# Patient Record
Sex: Male | Born: 1945
Health system: Southern US, Community
[De-identification: ages and names within clinical notes are randomized; demographics above are authoritative.]

## PROBLEM LIST (undated history)

## (undated) DIAGNOSIS — E119 Type 2 diabetes mellitus without complications: Secondary | ICD-10-CM

## (undated) DIAGNOSIS — E785 Hyperlipidemia, unspecified: Secondary | ICD-10-CM

## (undated) HISTORY — PX: CATARACT EXTRACTION, BILATERAL: SHX1313

---

## 2013-02-28 DIAGNOSIS — M7500 Adhesive capsulitis of unspecified shoulder: Secondary | ICD-10-CM | POA: Insufficient documentation

## 2013-02-28 DIAGNOSIS — E1142 Type 2 diabetes mellitus with diabetic polyneuropathy: Secondary | ICD-10-CM | POA: Insufficient documentation

## 2013-02-28 DIAGNOSIS — M159 Polyosteoarthritis, unspecified: Secondary | ICD-10-CM | POA: Insufficient documentation

## 2013-07-24 ENCOUNTER — Ambulatory Visit (INDEPENDENT_AMBULATORY_CARE_PROVIDER_SITE_OTHER): Payer: PRIVATE HEALTH INSURANCE | Admitting: Internal Medicine

## 2013-07-24 DIAGNOSIS — Z789 Other specified health status: Secondary | ICD-10-CM

## 2013-07-24 DIAGNOSIS — Z23 Encounter for immunization: Secondary | ICD-10-CM

## 2013-07-24 MED ORDER — MEFLOQUINE HCL 250 MG PO TABS: 250.0000 mg | ORAL_TABLET | ORAL | Status: DC

## 2013-07-24 MED ORDER — AZITHROMYCIN 500 MG PO TABS: 500.0000 mg | ORAL_TABLET | Freq: Every day | ORAL | Status: DC

## 2013-07-24 NOTE — Progress Notes (Signed)
[facility]  RFV: pre-travel counseling   Subjective:    Patient ID: Garrett Rice, male    DOB: 1946/04/28, 67 y.o.   MRN: [id number]  HPI [name] is a 67yo M who is born in [place]. He is going to [place] after leaving in the mid 1980s with his wife to visit family. He is Leaving nov 10-dec 15th. [place] will [place] then [place] 1 hr to visit family. [place].  All: NKMA Med: metformin, avastin Pmhx: dm, HLD    Review of Systems     Objective:   Physical Exam        Assessment & Plan:  Will give mefloquine Gave typhoid inj Gave rx azithro for traveler's diarrhea

## 2013-10-12 DIAGNOSIS — K219 Gastro-esophageal reflux disease without esophagitis: Secondary | ICD-10-CM | POA: Insufficient documentation

## 2013-10-12 DIAGNOSIS — E119 Type 2 diabetes mellitus without complications: Secondary | ICD-10-CM | POA: Insufficient documentation

## 2014-01-11 DIAGNOSIS — E785 Hyperlipidemia, unspecified: Secondary | ICD-10-CM | POA: Insufficient documentation

## 2014-01-11 DIAGNOSIS — I10 Essential (primary) hypertension: Secondary | ICD-10-CM | POA: Insufficient documentation

## 2015-09-30 DIAGNOSIS — H353131 Nonexudative age-related macular degeneration, bilateral, early dry stage: Secondary | ICD-10-CM | POA: Insufficient documentation

## 2016-01-19 DIAGNOSIS — E039 Hypothyroidism, unspecified: Secondary | ICD-10-CM | POA: Insufficient documentation

## 2018-03-20 ENCOUNTER — Encounter (HOSPITAL_BASED_OUTPATIENT_CLINIC_OR_DEPARTMENT_OTHER): Payer: Self-pay | Admitting: Emergency Medicine

## 2018-03-20 ENCOUNTER — Emergency Department (HOSPITAL_BASED_OUTPATIENT_CLINIC_OR_DEPARTMENT_OTHER)
Admission: EM | Admit: 2018-03-20 | Discharge: 2018-03-20 | Disposition: A | Discharge status: Home / Self Care | Payer: PRIVATE HEALTH INSURANCE | Attending: Emergency Medicine | Admitting: Emergency Medicine

## 2018-03-20 ENCOUNTER — Other Ambulatory Visit: Payer: Self-pay

## 2018-03-20 ENCOUNTER — Emergency Department (HOSPITAL_BASED_OUTPATIENT_CLINIC_OR_DEPARTMENT_OTHER): Payer: PRIVATE HEALTH INSURANCE

## 2018-03-20 DIAGNOSIS — Y9289 Other specified places as the place of occurrence of the external cause: Secondary | ICD-10-CM | POA: Insufficient documentation

## 2018-03-20 DIAGNOSIS — Y990 Civilian activity done for income or pay: Secondary | ICD-10-CM | POA: Diagnosis not present

## 2018-03-20 DIAGNOSIS — E119 Type 2 diabetes mellitus without complications: Secondary | ICD-10-CM | POA: Insufficient documentation

## 2018-03-20 DIAGNOSIS — Y9389 Activity, other specified: Secondary | ICD-10-CM | POA: Diagnosis not present

## 2018-03-20 DIAGNOSIS — S99922A Unspecified injury of left foot, initial encounter: Secondary | ICD-10-CM | POA: Diagnosis present

## 2018-03-20 DIAGNOSIS — W231XXA Caught, crushed, jammed, or pinched between stationary objects, initial encounter: Secondary | ICD-10-CM | POA: Diagnosis not present

## 2018-03-20 DIAGNOSIS — S9032XA Contusion of left foot, initial encounter: Secondary | ICD-10-CM | POA: Diagnosis not present

## 2018-03-20 DIAGNOSIS — S93402A Sprain of unspecified ligament of left ankle, initial encounter: Secondary | ICD-10-CM | POA: Diagnosis not present

## 2018-03-20 HISTORY — DX: Type 2 diabetes mellitus without complications: E11.9

## 2018-03-20 MED ORDER — TRAMADOL HCL 50 MG PO TABS: 50.0000 mg | ORAL_TABLET | Freq: Four times a day (QID) | ORAL | 0 refills | Status: DC | PRN

## 2018-03-20 MED ORDER — DICLOFENAC SODIUM 1 % TD GEL: 4.0000 g | Freq: Four times a day (QID) | TRANSDERMAL | 0 refills | Status: DC

## 2018-03-20 MED FILL — DICLOFENAC SODIUM 1% GEL: 1 | 7 days supply | Qty: 100 | Fill #0

## 2018-03-20 MED FILL — traMADol HCL 50 MG TABS: 50 | 3 days supply | Qty: 15 | Fill #0

## 2018-03-20 NOTE — Discharge Instructions (Signed)
You have been evaluated for your recent injury.  Fortunately no evidence of broken bones.  Please wear ACE wrap for support along with postop shoe.  You may need a walker to help with walking around.  Keep foot elevated, use ice to help decrease swelling.  You may take 1,000mg  of Tylenol every 6 hrs as needed at home. You may take Tramadol if tylenol doesn't provide adequate pain control.  Follow up with your doctor for further care.  Use voltaren gel 4 times daily by apply to affected area for pain control.
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
## 2018-03-20 NOTE — ED Provider Notes (Signed)
[facility] EMERGENCY DEPARTMENT Provider Note   CSN: [id number] Arrival date & time: 03/20/18  1553     History   Chief Complaint Chief Complaint  Patient presents with  . Foot Injury    HPI Garrett Rice is a 72 y.o. male.  HPI   72 year old male with history of diabetes presenting for evaluation of left foot injury.  History obtained through daughter who was at bedside.  Patient report while at work, a forklift ran over his left foot.  He developed immediate pain upon impact.  Pain is sharp throbbing worse with ambulation.  Since then it has become more swollen and bruised up.  No associated numbness.  Is taking Tylenol at home and currently rates pain as 5 out of 10.  No knee or hip pain.  He is not on any blood thinner medication.  Past Medical History:  Diagnosis Date  . Diabetes mellitus without complication (HCC)     There are no active problems to display for this patient.   History reviewed. No pertinent surgical history.      Home Medications    Prior to Admission medications   Medication Sig Start Date End Date Taking? Authorizing Provider  azithromycin (ZITHROMAX) 500 MG tablet Take 1 tablet (500 mg total) by mouth daily. If needed for 3 diarrheal stools/24hr. Can stop taking if diarrhea resolves 07/24/13   Judyann MunsonSnider, Cynthia, MD  mefloquine (LARIAM) 250 MG tablet Take 1 tablet (250 mg total) by mouth every 7 (seven) days. Start 2 wks prior to leaving, take 1 tab weekly until 4 wks after returning from [place] 07/24/13   Judyann MunsonSnider, Cynthia, MD    Family History History reviewed. No pertinent family history.  Social History Social History   Tobacco Use  . Smoking status: Never Smoker  . Smokeless tobacco: Never Used  Substance Use Topics  . Alcohol use: Never    Frequency: Never  . Drug use: Never     Allergies   Patient has no known allergies.   Review of Systems Review of Systems  Constitutional: Negative for fever.    Musculoskeletal: Positive for arthralgias.  Skin: Negative for wound.  Neurological: Negative for numbness.     Physical Exam Updated Vital Signs BP (!) 149/65 (BP Location: Left Arm)   Pulse 76   Temp 98.7 F (37.1 C) (Oral)   Resp 18   Wt 65.8 kg (145 lb)   SpO2 98%   Physical Exam  Constitutional: He appears well-developed and well-nourished. No distress.  HENT:  Head: Atraumatic.  Eyes: Conjunctivae are normal.  Neck: Neck supple.  Musculoskeletal: He exhibits tenderness (Left foot/ankle: Moderate swelling and ecchymosis noted throughout the dorsum of the foot and ankle without crepitus or deformity.  Dorsalis pedis pulse palpable, brisk cap refill.  No skin breakage.  Decreased ankle range of motion secondary to pain.).  Left hip and knee normal.  Neurological: He is alert.  Skin: No rash noted.  Psychiatric: He has a normal mood and affect.  Nursing note and vitals reviewed.    ED Treatments / Results  Labs (all labs ordered are listed, but only abnormal results are displayed) Labs Reviewed - No data to display  EKG None  Radiology Dg Ankle Complete Left  Result Date: 03/20/2018 CLINICAL DATA:  Forklift injury EXAM: LEFT ANKLE COMPLETE - 3+ VIEW COMPARISON:  None. FINDINGS: There is no evidence of fracture, dislocation, or joint effusion. There is no evidence of arthropathy or other focal bone abnormality. Moderate left  lateral malleolus soft tissue swelling. IMPRESSION: Moderate lateral malleolar soft tissue swelling without acute fracture or dislocation. Electronically Signed   By: Deatra Robinson M.D.   On: 03/20/2018 17:12   Dg Foot Complete Left  Result Date: 03/20/2018 CLINICAL DATA:  Forklift injury EXAM: LEFT FOOT - COMPLETE 3+ VIEW COMPARISON:  None. FINDINGS: There is no evidence of fracture or dislocation. There is no evidence of arthropathy or other focal bone abnormality. Soft tissues are unremarkable. IMPRESSION: Negative. Electronically Signed   By:  Deatra Robinson M.D.   On: 03/20/2018 17:13    Procedures Procedures (including critical care time)  Medications Ordered in ED Medications - No data to display   Initial Impression / Assessment and Plan / ED Course  I have reviewed the triage vital signs and the nursing notes.  Pertinent labs & imaging results that were available during my care of the patient were reviewed by me and considered in my medical decision making (see chart for details).     BP (!) 149/65 (BP Location: Left Arm)   Pulse 76   Temp 98.7 F (37.1 C) (Oral)   Resp 18   Wt 65.8 kg (145 lb)   SpO2 98%    Final Clinical Impressions(s) / ED Diagnoses   Final diagnoses:  Contusion of left foot, initial encounter  Moderate left ankle sprain, initial encounter    ED Discharge Orders        Ordered    traMADol (ULTRAM) 50 MG tablet  Every 6 hours PRN     03/20/18 1725    diclofenac sodium (VOLTAREN) 1 % GEL  4 times daily     03/20/18 1727     4:28 PM Patient suffered a crush injury when a forklift ran over his left foot while at work yesterday.  Will obtain x-ray to assess for injury.  I offered patient pain medication but he declined.  There is no break in the skin.  He is neurovascularly intact.  5:31 PM X-ray of left ankle shows moderate lateral malleolar soft tissue swelling without acute fractures or dislocation.  X-ray of the left foot is unremarkable.  Treatment plan includes Ace wrap, postop shoe, walker as needed.  Patient discharged home with pain medication including Tylenol or tramadol and Voltaren gel.  Instruction provided work note provided.  Care discussed with Dr. Anitra Lauth. In order to decrease risk of narcotic abuse. Pt's record were checked using the [place] Controlled Substance database.     Fayrene Helper, PA-C 03/20/18 1733    Gwyneth Sprout, MD 03/21/18 1353

## 2018-11-07 DIAGNOSIS — F32A Depression, unspecified: Secondary | ICD-10-CM | POA: Insufficient documentation

## 2020-02-10 IMAGING — DX DG FOOT COMPLETE 3+V*L*
3 series · 3 of 3 positions shown · non-contrast
Comparison: None.

CLINICAL DATA: Forklift injury

EXAM:
LEFT FOOT - COMPLETE 3+ VIEW

[foot ap]
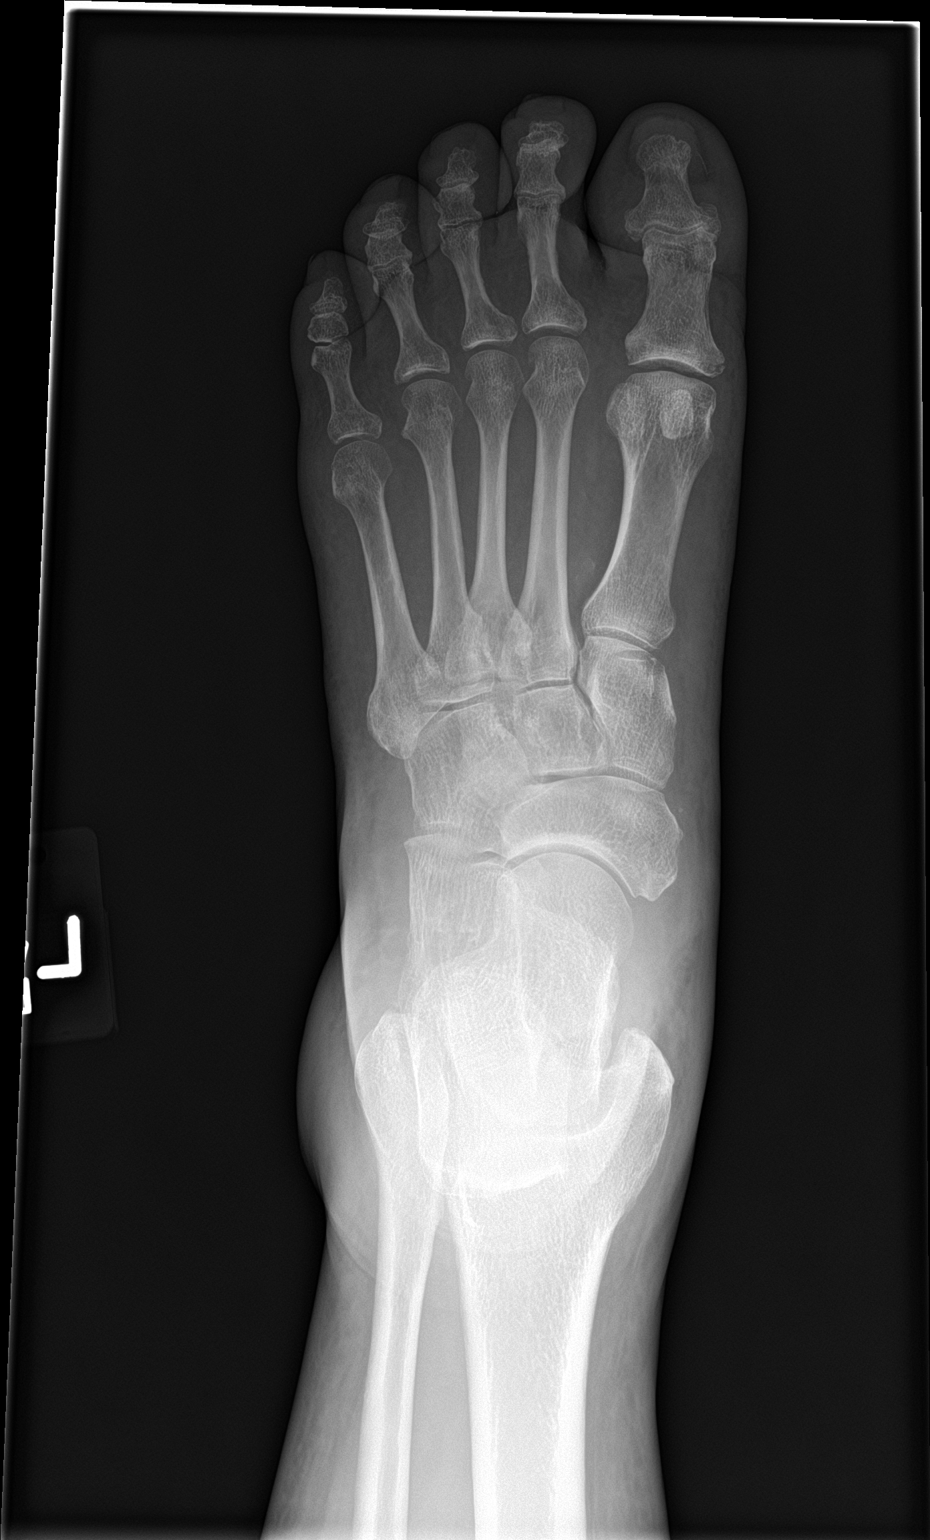

[foot obl]
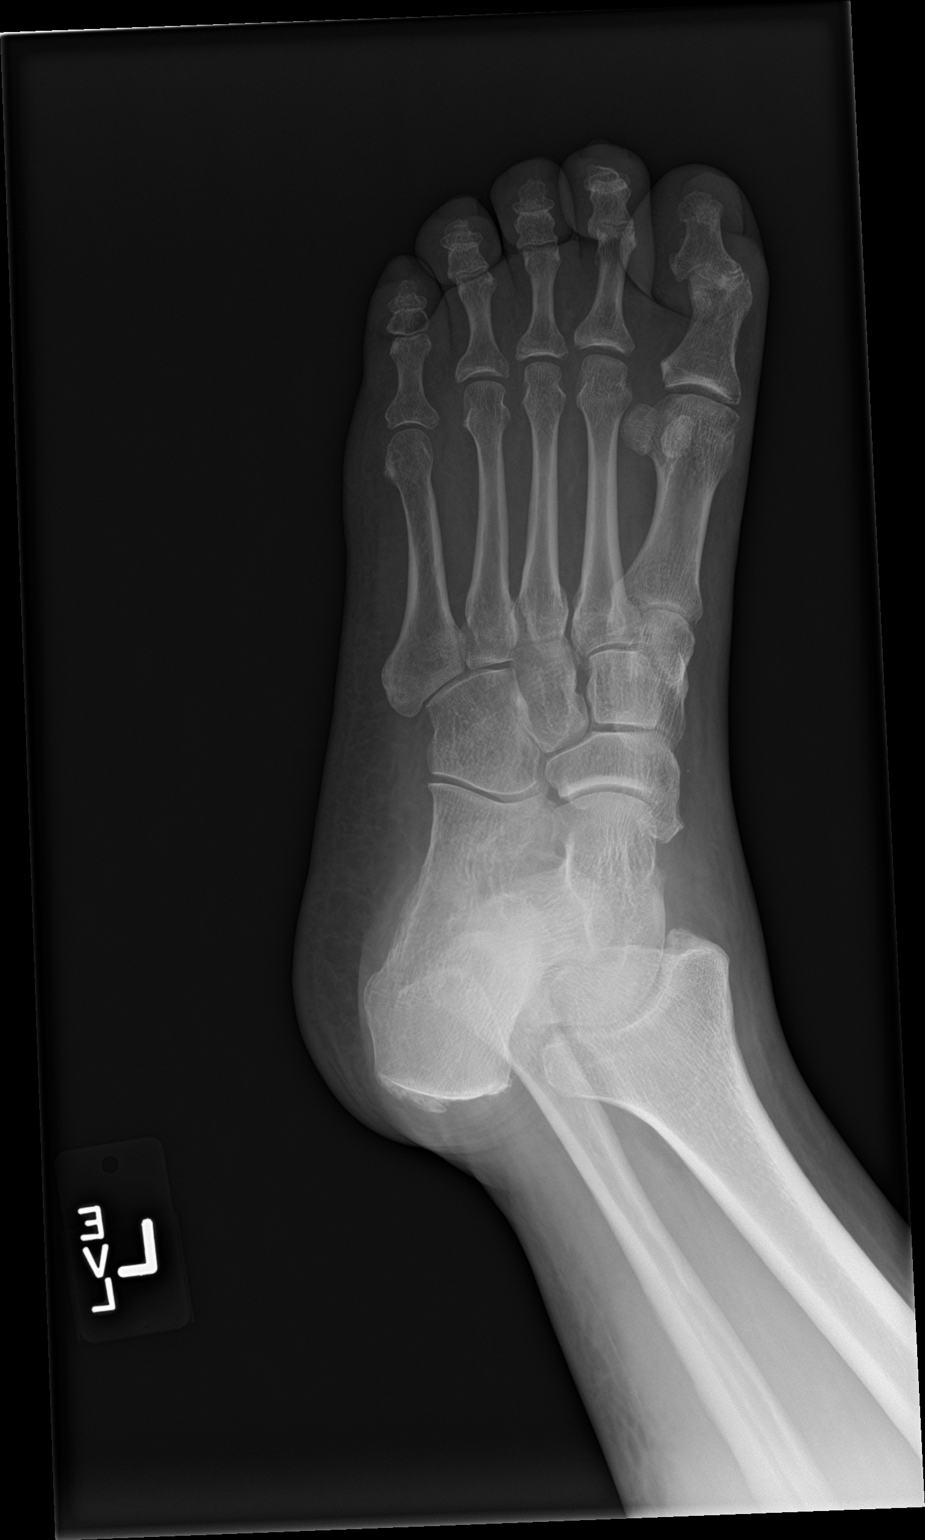

[foot lat]
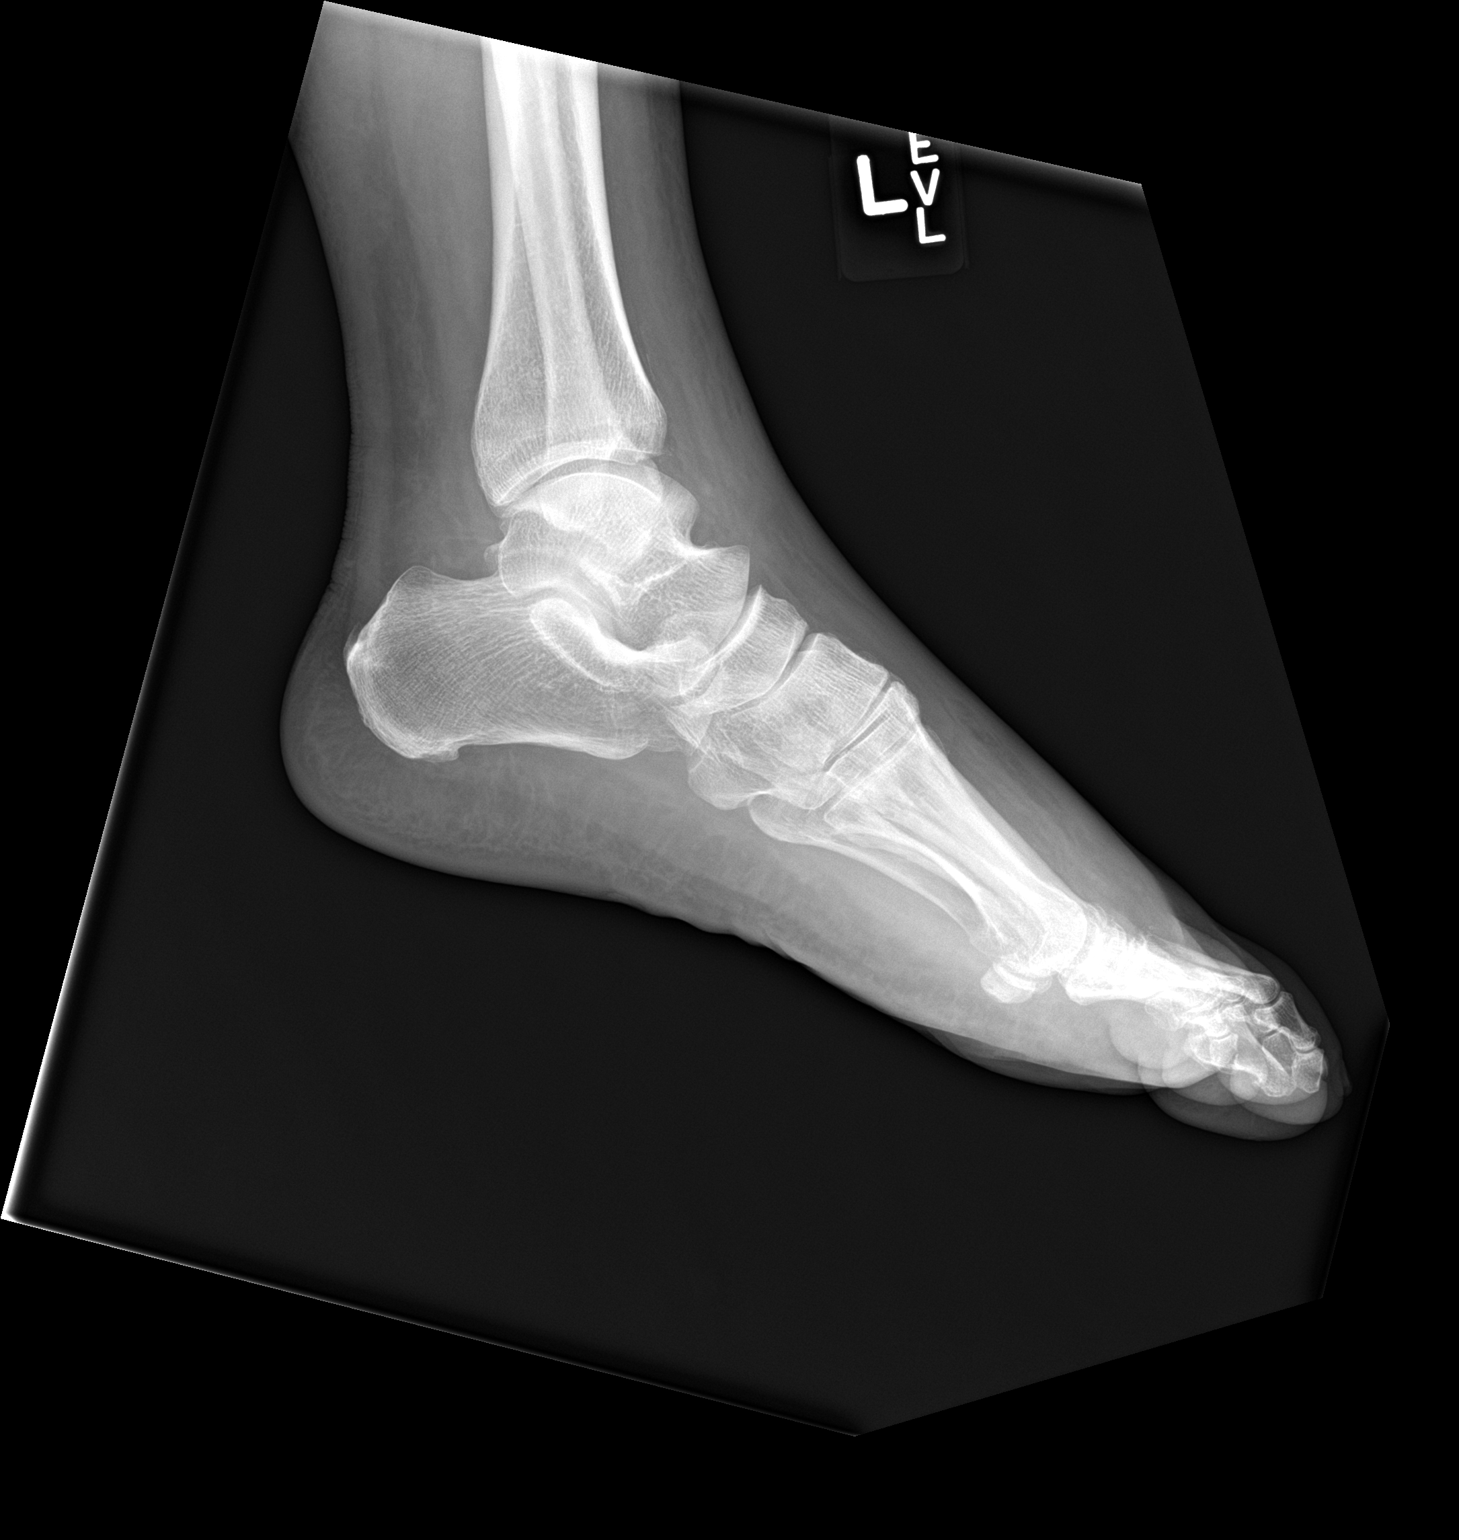

[3 of 3 positions shown; findings below may reference images not displayed]

FINDINGS: There is no evidence of fracture or dislocation. There is no
evidence of arthropathy or other focal bone abnormality. Soft
tissues are unremarkable.
IMPRESSION: Negative.

## 2020-04-11 ENCOUNTER — Other Ambulatory Visit: Payer: Self-pay

## 2020-04-11 ENCOUNTER — Encounter (HOSPITAL_BASED_OUTPATIENT_CLINIC_OR_DEPARTMENT_OTHER): Payer: Self-pay | Admitting: *Deleted

## 2020-04-11 ENCOUNTER — Emergency Department (HOSPITAL_BASED_OUTPATIENT_CLINIC_OR_DEPARTMENT_OTHER)
Admission: EM | Admit: 2020-04-11 | Discharge: 2020-04-11 | Disposition: A | Discharge status: Home / Self Care | Payer: PRIVATE HEALTH INSURANCE | Attending: Emergency Medicine | Admitting: Emergency Medicine

## 2020-04-11 DIAGNOSIS — M791 Myalgia, unspecified site: Secondary | ICD-10-CM | POA: Insufficient documentation

## 2020-04-11 DIAGNOSIS — R109 Unspecified abdominal pain: Secondary | ICD-10-CM | POA: Insufficient documentation

## 2020-04-11 DIAGNOSIS — E119 Type 2 diabetes mellitus without complications: Secondary | ICD-10-CM | POA: Insufficient documentation

## 2020-04-11 HISTORY — DX: Hyperlipidemia, unspecified: E78.5
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
## 2023-09-11 LAB — HM DIABETES EYE EXAM

## 2023-10-08 ENCOUNTER — Ambulatory Visit (INDEPENDENT_AMBULATORY_CARE_PROVIDER_SITE_OTHER): Payer: Medicare Other | Admitting: Family Medicine

## 2023-10-08 ENCOUNTER — Other Ambulatory Visit: Payer: Medicare Other

## 2023-10-08 ENCOUNTER — Encounter: Payer: Self-pay | Admitting: Family Medicine

## 2023-10-08 VITALS — BP 124/70 | HR 76 | Temp 98.7°F | Ht 63.5 in | Wt 130.0 lb

## 2023-10-08 DIAGNOSIS — I10 Essential (primary) hypertension: Secondary | ICD-10-CM

## 2023-10-08 DIAGNOSIS — Z1159 Encounter for screening for other viral diseases: Secondary | ICD-10-CM

## 2023-10-08 DIAGNOSIS — R634 Abnormal weight loss: Secondary | ICD-10-CM

## 2023-10-08 DIAGNOSIS — E039 Hypothyroidism, unspecified: Secondary | ICD-10-CM | POA: Diagnosis not present

## 2023-10-08 DIAGNOSIS — E1140 Type 2 diabetes mellitus with diabetic neuropathy, unspecified: Secondary | ICD-10-CM | POA: Insufficient documentation

## 2023-10-08 DIAGNOSIS — E782 Mixed hyperlipidemia: Secondary | ICD-10-CM | POA: Diagnosis not present

## 2023-10-08 DIAGNOSIS — E1142 Type 2 diabetes mellitus with diabetic polyneuropathy: Secondary | ICD-10-CM

## 2023-10-08 DIAGNOSIS — Z7984 Long term (current) use of oral hypoglycemic drugs: Secondary | ICD-10-CM

## 2023-10-08 LAB — URINALYSIS, ROUTINE W REFLEX MICROSCOPIC
Bilirubin Urine: NEGATIVE
Hgb urine dipstick: NEGATIVE
Leukocytes,Ua: NEGATIVE
Nitrite: NEGATIVE
Specific Gravity, Urine: 1.01 (ref 1.000–1.030)
Total Protein, Urine: NEGATIVE
Urine Glucose: >=1000 — AB
Urobilinogen, UA: 0.2 (ref 0.0–1.0)
pH: 6.5 (ref 5.0–8.0)

## 2023-10-08 LAB — COMPREHENSIVE METABOLIC PANEL
ALT: 32 U/L (ref 0–53)
AST: 19 U/L (ref 0–37)
Albumin: 4.6 g/dL (ref 3.5–5.2)
Alkaline Phosphatase: 89 U/L (ref 39–117)
BUN: 20 mg/dL (ref 6–23)
CO2: 31 meq/L (ref 19–32)
Calcium: 9.9 mg/dL (ref 8.4–10.5)
Chloride: 95 meq/L — ABNORMAL LOW (ref 96–112)
Creatinine, Ser: 1 mg/dL (ref 0.40–1.50)
GFR: 73.07 mL/min (ref >60.00)
Glucose, Bld: 339 mg/dL — ABNORMAL HIGH (ref 70–99)
Potassium: 4.6 meq/L (ref 3.5–5.1)
Sodium: 136 meq/L (ref 135–145)
Total Bilirubin: 0.5 mg/dL (ref 0.2–1.2)
Total Protein: 7.3 g/dL (ref 6.0–8.3)

## 2023-10-08 LAB — LIPID PANEL
Cholesterol: 206 mg/dL — ABNORMAL HIGH (ref 0–200)
HDL: 65.8 mg/dL (ref >39.00)
LDL Cholesterol: 108 mg/dL — ABNORMAL HIGH (ref 0–99)
NonHDL: 140.41
Total CHOL/HDL Ratio: 3
Triglycerides: 161 mg/dL — ABNORMAL HIGH (ref 0.0–149.0)
VLDL: 32.2 mg/dL (ref 0.0–40.0)

## 2023-10-08 LAB — TSH: TSH: 2.85 u[IU]/mL (ref 0.35–5.50)

## 2023-10-08 LAB — MICROALBUMIN / CREATININE URINE RATIO
Creatinine,U: 49.1 mg/dL
Microalb Creat Ratio: 1.5 mg/g (ref 0.0–30.0)
Microalb, Ur: 0.7 mg/dL (ref 0.0–1.9)

## 2023-10-08 MED ORDER — EZETIMIBE 10 MG PO TABS: 10.0000 mg | ORAL_TABLET | Freq: Every day | ORAL | 3 refills | Status: DC

## 2023-10-08 MED ORDER — EMPAGLIFLOZIN 10 MG PO TABS: 10.0000 mg | ORAL_TABLET | Freq: Every day | ORAL | 2 refills | Status: DC

## 2023-10-08 NOTE — Assessment & Plan Note (Signed)
 We will focus on optimal blood sugar control.

## 2023-10-08 NOTE — Assessment & Plan Note (Signed)
 I will check annual DM labs to assess his current control. Based on his A1c last year and his weight loss, I anticipate we will need to add to his current therapy. For now, he will continue metformin 500 mg bid and glipizide 10 mg daily.

## 2023-10-08 NOTE — Progress Notes (Signed)
 [facility] [address] [place] [address] Dept: [phone] Dept Fax: [phone]  New Patient Office Visit  Subjective:    Patient ID: Garrett Rice, male    DOB: 10-24-1945, 78 y.o..   MRN: [id number]  Chief Complaint  Patient presents with   Establish Care   Hypertension   Diabetes   Hyperlipidemia   Hypothyroidism   History of Present Illness:  Patient is in today to establish care. Garrett Rice was born in [place]. [place]  in [place]. He worked as a location manager for many years. He has been married for 50+ years. He has four children, a daughter Architect) and three sons. He has 13 grandchildren, and 1 great grandchild. He quit tobacco use 30 years ago. He only drinks alcohol on special occasions.  Garrett Rice has Type 2 diabetes. He is managed on metformin  500 mg twice daily and glipizide  10 mg daily. His daughter notes a concern over weight loss (down 18 lbs int he past 4 years). She also reports his A1c being > 12.  He has been seen by his ophthalmologist recently, who raised further concerns abouthis diabetes control.  Garrett Rice has hypertension. He is managed on HCTZ 12.5 mg daily and lisinopril  20 mg dialy.  Garrett Rice has hyperlipidemia. He is managed on atorvastatin  80 mg daily.  Garrett Rice has a history of hypothyroidism. He is managed on levothyroxine  88 mcg daily.  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 No pertinent family history. Outpatient Medications Prior to Visit  Medication Sig Dispense Refill   aspirin EC 81 MG tablet Take 81 mg by mouth daily.     atorvastatin  (LIPITOR) 80 MG tablet Take 80 mg by mouth daily.     glipiZIDE  (GLUCOTROL  XL) 10 MG 24 hr tablet Take 10 mg by mouth daily.     glucose blood (PRECISION QID TEST) test strip 1 each by Other route as needed.     hydrochlorothiazide  (HYDRODIURIL ) 12.5 MG tablet Take 12.5 mg by mouth daily.     levothyroxine  (SYNTHROID ) 88 MCG tablet Take 88 mcg by mouth daily before breakfast.     lisinopril  (ZESTRIL ) 20 MG tablet Take 20 mg by mouth daily.     metFORMIN  (GLUCOPHAGE ) 500 MG tablet Take 500 mg by mouth 2 (two) times daily with a meal.     Olopatadine HCl 0.2 % SOLN Apply to eye.     omeprazole (PRILOSEC) 40 MG capsule Take 40 mg by mouth 2 (two) times daily.     azithromycin  (ZITHROMAX ) 500 MG tablet Take 1 tablet (500 mg total) by mouth daily. If needed for 3 diarrheal stools/24hr. Can stop taking if diarrhea resolves 10 tablet 0   diclofenac  sodium (VOLTAREN ) 1 % GEL Apply 4 g topically 4 (four) times daily. 100 g 0   mefloquine  (LARIAM ) 250 MG tablet Take 1 tablet (250 mg total) by mouth every 7 (  seven) days. Start 2 wks prior to leaving, take 1 tab weekly until 4 wks after returning from [place] 22 tablet 0   traMADol  (ULTRAM ) 50 MG tablet Take 1 tablet (50 mg total) by mouth every 6 (six) hours as needed for moderate pain or severe pain. 15 tablet 0   No facility-administered medications prior to visit.   No Known Allergies Objective:   Today's Vitals   10/08/23 1017  BP: 124/70  Pulse: 76  Temp: 98.7 F (37.1 C)  TempSrc: Temporal  SpO2: 99%  Weight: 130 lb (59 kg)  Height: 5' 3.5 (1.613 m)   Body mass index is 22.67 kg/m.    General: Well developed, well nourished. No acute distress. Feet- Skin intact. No sign of maceration between toes. Nails are normal. Dorsalis pedis and posterior tibial   artery pulses are normal. 5.07 monofilament testing with some small aras that are insensate. There is a 4-5   mm round, black mole on the dorsolateral aspect of the left foot. Psych: Alert and oriented. Normal mood and affect.  Health Maintenance Due  Topic Date Due   HEMOGLOBIN A1C  Never done   Diabetic kidney evaluation - eGFR measurement  Never done   Diabetic kidney evaluation - Urine ACR  Never done   Hepatitis C Screening  Never done   Zoster Vaccines- Shingrix (1 of 2) Never done   Medicare Annual Wellness (AWV)  10/24/2023     Assessment & Plan:   Problem List Items Addressed This Visit       Cardiovascular and Mediastinum   Essential hypertension   Blood pressure is in good control. Continue HCTZ 12.5 mg daily and lisinopril  20 mg daily. I will check annual renal labs.      Relevant Medications   aspirin EC 81 MG tablet   atorvastatin  (LIPITOR) 80 MG tablet   hydrochlorothiazide  (HYDRODIURIL ) 12.5 MG tablet   lisinopril  (ZESTRIL ) 20 MG tablet   Other Relevant Orders   Comprehensive metabolic panel     Endocrine   Acquired hypothyroidism   I will check the TSH today. Continue levothyroxine  88 mcg daily for now.      Relevant Medications   levothyroxine  (SYNTHROID ) 88 MCG tablet   Other Relevant Orders   TSH   Diabetic neuropathy (HCC)   We will focus on optimal blood sugar control.      Relevant Medications   aspirin EC 81 MG tablet   atorvastatin  (LIPITOR) 80 MG tablet   glipiZIDE  (GLUCOTROL  XL) 10 MG 24 hr tablet   lisinopril  (ZESTRIL ) 20 MG tablet   metFORMIN  (GLUCOPHAGE ) 500 MG tablet   Type 2 diabetes mellitus with diabetic polyneuropathy (HCC) - Primary   I will check annual DM labs to assess his current control. Based on his A1c last year and his weight loss, I anticipate we  will need to add to his current therapy. For now, he will continue metformin  500 mg bid and glipizide  10 mg daily.      Relevant Medications   aspirin EC 81 MG tablet   atorvastatin  (LIPITOR) 80 MG tablet   glipiZIDE  (GLUCOTROL  XL) 10 MG 24 hr tablet   lisinopril  (ZESTRIL ) 20 MG tablet   metFORMIN  (GLUCOPHAGE ) 500 MG tablet   Other Relevant Orders   Microalbumin / creatinine urine ratio   Hemoglobin A1c   Urinalysis, Routine w reflex microscopic   Comprehensive metabolic panel     Other   Hyperlipidemia (Chronic)   I will check lipids today. Continue atorvastatin   80 mg daily for now.      Relevant Medications   aspirin EC 81 MG tablet   atorvastatin  (LIPITOR) 80 MG tablet   hydrochlorothiazide  (HYDRODIURIL ) 12.5 MG tablet   lisinopril  (ZESTRIL ) 20 MG tablet   Other Relevant Orders   Lipid panel   Other Visit Diagnoses       Weight loss       Etiology uncertain, but likely due to uncontrolled Type 2 diabetes.   Relevant Orders   Hemoglobin A1c   TSH   Comprehensive metabolic panel     Encounter for hepatitis C screening test for low risk patient       Relevant Orders   HCV Ab w Reflex to Quant PCR       Return for Annual preventative care.   Garnette CHRISTELLA Simpler, MD

## 2023-10-08 NOTE — Assessment & Plan Note (Signed)
 I will check lipids today. Continue atorvastatin 80 mg daily for now.

## 2023-10-08 NOTE — Assessment & Plan Note (Signed)
 I will check the TSH today. Continue levothyroxine 88 mcg daily for now.

## 2023-10-08 NOTE — Assessment & Plan Note (Signed)
 Blood pressure is in good control. Continue HCTZ 12.5 mg daily and lisinopril 20 mg daily. I will check annual renal labs.

## 2023-10-08 NOTE — Addendum Note (Signed)
 Addended by: Loyola Mast on: 10/08/2023 05:57 PM   Modules accepted: Orders

## 2023-10-09 LAB — HEMOGLOBIN A1C: Hgb A1c MFr Bld: >14 %{Hb} — ABNORMAL HIGH (ref <5.7)

## 2023-10-09 LAB — HCV INTERPRETATION

## 2023-10-09 LAB — HCV AB W REFLEX TO QUANT PCR: HCV Ab: NONREACTIVE

## 2023-10-16 ENCOUNTER — Encounter: Payer: Self-pay | Admitting: Family Medicine

## 2023-10-28 ENCOUNTER — Telehealth: Payer: Self-pay

## 2023-10-28 NOTE — Telephone Encounter (Signed)
Copied from CRM [id number]. Topic: General - Other >> Oct 28, 2023  2:13 PM Truddie Crumble wrote: Reason for CRM: patient daughter returning a call to the CMA regarding the patient lab results  Spoke to [facility], she just picked up the new medication and scheduled an appointment 11/19/23.  No further questions. Dm/cma

## 2023-11-19 ENCOUNTER — Ambulatory Visit (INDEPENDENT_AMBULATORY_CARE_PROVIDER_SITE_OTHER): Payer: Medicare Other | Admitting: Family Medicine

## 2023-11-19 ENCOUNTER — Encounter: Payer: Self-pay | Admitting: Family Medicine

## 2023-11-19 VITALS — BP 114/60 | HR 74 | Temp 98.0°F | Ht 63.5 in | Wt 126.0 lb

## 2023-11-19 DIAGNOSIS — I10 Essential (primary) hypertension: Secondary | ICD-10-CM

## 2023-11-19 DIAGNOSIS — Z7984 Long term (current) use of oral hypoglycemic drugs: Secondary | ICD-10-CM | POA: Diagnosis not present

## 2023-11-19 DIAGNOSIS — E782 Mixed hyperlipidemia: Secondary | ICD-10-CM | POA: Diagnosis not present

## 2023-11-19 DIAGNOSIS — E039 Hypothyroidism, unspecified: Secondary | ICD-10-CM | POA: Diagnosis not present

## 2023-11-19 DIAGNOSIS — E1142 Type 2 diabetes mellitus with diabetic polyneuropathy: Secondary | ICD-10-CM

## 2023-11-19 LAB — GLUCOSE, POCT (MANUAL RESULT ENTRY): POC Glucose: HIGH mg/dL — ABNORMAL HIGH (ref 70–99)

## 2023-11-19 MED ORDER — BLOOD GLUCOSE TEST VI STRP: 1.0000 | ORAL_STRIP | Freq: Three times a day (TID) | 0 refills | Status: AC

## 2023-11-19 MED ORDER — GLIPIZIDE ER 10 MG PO TB24: 10.0000 mg | ORAL_TABLET | Freq: Every day | ORAL | 3 refills | Status: DC

## 2023-11-19 MED ORDER — EMPAGLIFLOZIN 25 MG PO TABS: 25.0000 mg | ORAL_TABLET | Freq: Every day | ORAL | 3 refills | Status: DC

## 2023-11-19 MED ORDER — LANCETS MISC. MISC: 1.0000 | Freq: Three times a day (TID) | 0 refills | Status: AC

## 2023-11-19 MED ORDER — LANCET DEVICE MISC: 1.0000 | Freq: Three times a day (TID) | 0 refills | Status: AC

## 2023-11-19 MED ORDER — BLOOD GLUCOSE MONITORING SUPPL DEVI: 1.0000 | Freq: Three times a day (TID) | 0 refills | Status: AC

## 2023-11-19 NOTE — Assessment & Plan Note (Signed)
Continue atorvastatin 80 mg daily and ezetimibe 10 mg daily

## 2023-11-19 NOTE — Progress Notes (Signed)
 [facility] [address] [place] [address] Dept: [phone] Dept Fax: [phone]  Chronic Care Office Visit  Subjective:    Patient ID: Garrett Rice, male    DOB: 12/22/1945, 78 y.o..   MRN: [id number]  Chief Complaint  Patient presents with   Diabetes   Medical Interpretor: Garrett Rice (his daughter, but also a Museum/gallery exhibitions officer).  History of Present Illness:  Patient is in today for reassessment of chronic medical issues.  [name] has Type 2 diabetes. He is managed on metformin 500 mg twice daily, glipizide 10 mg daily and empagliflozin (Jardiance) 10 mg daily (added after his first visit last month). He had been having some unintended weight loss, being down 18 lbs over the past 4 years. His last A1c was > 14.   [name] has hypertension. He is managed on HCTZ 12.5 mg daily and lisinopril 20 mg dialy.   [name] has hyperlipidemia. He is managed on atorvastatin 80 mg daily and ezetimibe 10 mg daily (added after last visit).   [name] has a history of hypothyroidism. He is managed on levothyroxine 88 mcg daily.  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 No pertinent family history. Outpatient Medications Prior to Visit  Medication Sig Dispense Refill    aspirin EC 81 MG tablet Take 81 mg by mouth daily.     atorvastatin (LIPITOR) 80 MG tablet Take 80 mg by mouth daily.     ezetimibe (ZETIA) 10 MG tablet Take 1 tablet (10 mg total) by mouth daily. 90 tablet 3   glucose blood (PRECISION QID TEST) test strip 1 each by Other route as needed.     hydrochlorothiazide (HYDRODIURIL) 12.5 MG tablet Take 12.5 mg by mouth daily.     levothyroxine (SYNTHROID) 88 MCG tablet Take 88 mcg by mouth daily before breakfast.     lisinopril (ZESTRIL) 20 MG tablet Take 20 mg by mouth daily.     metFORMIN (GLUCOPHAGE) 500 MG tablet Take 500 mg by mouth 2 (two) times daily with a meal.     Olopatadine HCl 0.2 % SOLN Apply to eye.     omeprazole (PRILOSEC) 40 MG capsule Take 40 mg by mouth 2 (two) times daily.     empagliflozin (JARDIANCE) 10 MG TABS tablet Take 1 tablet (10 mg total) by mouth daily before breakfast. 30 tablet 2   glipiZIDE (GLUCOTROL XL) 10 MG 24 hr tablet Take 10 mg by mouth daily.     No facility-administered medications prior to visit.   No Known Allergies Objective:   Today's Vitals   11/19/23 1345  BP: 114/60  Pulse: 74  Temp: 98 F (36.7 C)  TempSrc: Temporal  SpO2: 96%  Weight: 126 lb (57.2 kg)  Height: 5' 3.5" (1.613 m)   Body mass index is 21.97 kg/m.   FSBS: Hi  General: Well developed, well nourished. No acute distress. HEENT: Normocephalic, non-traumatic. External ears normal. EAC  and TMs normal bilaterally. PERRL,   EOMI. Conjunctiva clear. Nose clear without congestion or rhinorrhea. Mucous membranes moist.   Oropharynx clear. Neck: Supple. No lymphadenopathy. No thyromegaly. Lungs: Clear to auscultation bilaterally. No wheezing, rales or rhonchi. CV: RRR without murmurs or rubs. Pulses 2+ bilaterally. Abdomen: Soft, non-tender. Bowel sounds positive, normal pitch and frequency. No hepatosplenomegaly. No   rebound or guarding. Extremities: Full ROM. No joint swelling or tenderness. No edema noted. Skin: Warm and  dry. No rashes. Psych: Alert and oriented. Normal mood and affect.  Health Maintenance Due  Topic Date Due   Zoster Vaccines- Shingrix (1 of 2) Never done   Medicare Annual Wellness (AWV)  10/24/2023   Lab Results    Latest Ref Rng & Units 10/08/2023   10:59 AM  CMP  Glucose 70 - 99 mg/dL 213   BUN 6 - 23 mg/dL 20   Creatinine 0.86 - 1.50 mg/dL 5.78   Sodium 469 - 629 mEq/L 136   Potassium 3.5 - 5.1 mEq/L 4.6   Chloride 96 - 112 mEq/L 95   CO2 19 - 32 mEq/L 31   Calcium 8.4 - 10.5 mg/dL 9.9   Total Protein 6.0 - 8.3 g/dL 7.3   Total Bilirubin 0.2 - 1.2 mg/dL 0.5   Alkaline Phos 39 - 117 U/L 89   AST 0 - 37 U/L 19   ALT 0 - 53 U/L 32    Last lipids Lab Results  Component Value Date   CHOL 206 (H) 10/08/2023   HDL 65.80 10/08/2023   LDLCALC 108 (H) 10/08/2023   TRIG 161.0 (H) 10/08/2023   CHOLHDL 3 10/08/2023   Last hemoglobin A1c Lab Results  Component Value Date   HGBA1C >14.0 (H) 10/08/2023   Last thyroid functions Lab Results  Component Value Date   TSH 2.85 10/08/2023     Assessment & Plan:   Problem List Items Addressed This Visit       Cardiovascular and Mediastinum   Essential hypertension   Blood pressure is in good control. Continue HCTZ 12.5 mg daily and lisinopril 20 mg daily.        Endocrine   Acquired hypothyroidism   TSH is at goal. Continue levothyroxine 88 mcg daily.      Type 2 diabetes mellitus with diabetic polyneuropathy (HCC) - Primary   Diabetes remains in very poor control. Continue metformin 500 mg bid and glipizide 10 mg daily. I will increase his empagliflozin to 25 mg daily. I will also refer him for diabetic teaching.      Relevant Medications   glipiZIDE (GLUCOTROL XL) 10 MG 24 hr tablet   empagliflozin (JARDIANCE) 25 MG TABS tablet   Blood Glucose Monitoring Suppl DEVI   Glucose Blood (BLOOD GLUCOSE TEST STRIPS) STRP   Lancet Device MISC   Lancets Misc. MISC   Other Relevant Orders   Amb Referral to Nutrition and  Diabetic Education   POCT glucose (manual entry) (Completed)     Other   Hyperlipidemia (Chronic)   Continue atorvastatin 80 mg daily and ezetimibe 10 mg daily.       Return in about 6 weeks (around 12/31/2023) for Reassessment.   Loyola Mast, MD

## 2023-11-19 NOTE — Assessment & Plan Note (Signed)
 Blood pressure is in good control. Continue HCTZ 12.5 mg daily and lisinopril 20 mg daily.

## 2023-11-19 NOTE — Assessment & Plan Note (Addendum)
 Diabetes remains in very poor control. Continue metformin 500 mg bid and glipizide 10 mg daily. I will increase his empagliflozin to 25 mg daily. I will also refer him for diabetic teaching.

## 2023-11-19 NOTE — Assessment & Plan Note (Signed)
 TSH is at goal. Continue levothyroxine 88 mcg daily.

## 2024-01-01 ENCOUNTER — Ambulatory Visit: Payer: Medicare Other | Admitting: Family Medicine

## 2024-01-01 ENCOUNTER — Encounter: Payer: Self-pay | Admitting: Family Medicine

## 2024-01-01 ENCOUNTER — Telehealth: Payer: Self-pay

## 2024-01-01 VITALS — BP 124/66 | HR 80 | Temp 97.7°F | Ht 63.5 in | Wt 118.4 lb

## 2024-01-01 DIAGNOSIS — E1142 Type 2 diabetes mellitus with diabetic polyneuropathy: Secondary | ICD-10-CM | POA: Diagnosis not present

## 2024-01-01 DIAGNOSIS — E782 Mixed hyperlipidemia: Secondary | ICD-10-CM

## 2024-01-01 DIAGNOSIS — E039 Hypothyroidism, unspecified: Secondary | ICD-10-CM | POA: Diagnosis not present

## 2024-01-01 DIAGNOSIS — R634 Abnormal weight loss: Secondary | ICD-10-CM | POA: Diagnosis not present

## 2024-01-01 DIAGNOSIS — I10 Essential (primary) hypertension: Secondary | ICD-10-CM | POA: Diagnosis not present

## 2024-01-01 DIAGNOSIS — R2689 Other abnormalities of gait and mobility: Secondary | ICD-10-CM

## 2024-01-01 DIAGNOSIS — Z7984 Long term (current) use of oral hypoglycemic drugs: Secondary | ICD-10-CM

## 2024-01-01 LAB — LIPID PANEL
Cholesterol: 111 mg/dL (ref 0–200)
HDL: 43.6 mg/dL (ref >39.00)
LDL Cholesterol: 45 mg/dL (ref 0–99)
NonHDL: 66.98
Total CHOL/HDL Ratio: 3
Triglycerides: 110 mg/dL (ref 0.0–149.0)
VLDL: 22 mg/dL (ref 0.0–40.0)

## 2024-01-01 LAB — GLUCOSE, RANDOM: Glucose, Bld: 226 mg/dL — ABNORMAL HIGH (ref 70–99)

## 2024-01-01 LAB — HEMOGLOBIN A1C: Hgb A1c MFr Bld: 14.4 % — ABNORMAL HIGH (ref 4.6–6.5)

## 2024-01-01 MED ORDER — SITAGLIPTIN 25 MG PO TABS: 25.0000 mg | ORAL_TABLET | Freq: Every day | ORAL | 3 refills | Status: DC

## 2024-01-01 MED ORDER — HYDROCHLOROTHIAZIDE 12.5 MG PO TABS
12.5000 mg | ORAL_TABLET | Freq: Every day | ORAL | 3 refills | Status: AC
Start: 2024-01-01 — End: ?

## 2024-01-01 MED ORDER — ATORVASTATIN CALCIUM 80 MG PO TABS: 80.0000 mg | ORAL_TABLET | Freq: Every day | ORAL | 3 refills | Status: DC

## 2024-01-01 MED ORDER — LISINOPRIL 20 MG PO TABS: 20.0000 mg | ORAL_TABLET | Freq: Every day | ORAL | 3 refills | Status: DC

## 2024-01-01 MED ORDER — LEVOTHYROXINE SODIUM 88 MCG PO TABS: 88.0000 ug | ORAL_TABLET | Freq: Every day | ORAL | 3 refills | Status: DC

## 2024-01-01 MED ORDER — METFORMIN HCL 500 MG PO TABS
500.0000 mg | ORAL_TABLET | Freq: Two times a day (BID) | ORAL | 3 refills | Status: DC
Start: 2024-01-01 — End: 2024-04-01

## 2024-01-01 NOTE — Assessment & Plan Note (Signed)
 Likely due to uncontrolled diabetes. We will continue to focus on improved glycemic control.

## 2024-01-01 NOTE — Assessment & Plan Note (Addendum)
 Family reports blood sugars are now 120-150, which is much improved from before. Continue metformin 500 mg 2 tabs bid, glipizide 10 mg daily and empagliflozin 25 mg daily. I will renew the referral for diabetic teaching and provide patient's daughter with the number to call regarding this. I will check his A1c today.

## 2024-01-01 NOTE — Assessment & Plan Note (Signed)
 This may be multifactorial. I will check x-rays of his left hip and ankle. Might consider neuro referral in light of weakness in the ankle dorsiflexors and mildly positive Romberg's sign.

## 2024-01-01 NOTE — Assessment & Plan Note (Signed)
 TSH is at goal. Continue levothyroxine 88 mcg daily.

## 2024-01-01 NOTE — Progress Notes (Addendum)
 [facility] [address] [place] [address] Dept: [phone] Dept Fax: [phone]  Chronic Care Office Visit  Subjective:    Patient ID: Garrett Rice, male    DOB: 06/30/1946, 78 y.o..   MRN: [id number]  Chief Complaint  Patient presents with   Diabetes    6  week f/u DM.  No concerns.   BS  120- 150.   Medical Interpretor: Garrett Rice (his daughter, but also a Museum/gallery exhibitions officer).   History of Present Illness:  Patient is in today for reassessment of chronic medical issues.  Garrett Rice has Type 2 diabetes. He is managed on metformin 500 mg 2 tabs twice daily, glipizide 10 mg daily and empagliflozin (Jardiance) 25 mg daily (increased at last visit). He had been having some unintended weight loss, being down 12 lbs over the past 3 months and 30 lbs in the past 4 years. His last A1c was > 14. I had referred Garrett Rice for diabetes teaching.    Garrett Rice has hypertension. He is managed on HCTZ 12.5 mg daily and lisinopril 20 mg dialy.   Garrett Rice has hyperlipidemia. He is managed on atorvastatin 80 mg daily and ezetimibe 10 mg daily.   Garrett Rice has a history of hypothyroidism. He is managed on levothyroxine 88 mcg daily.  Garrett Rice daughter has concerns about his unsteadiness when walking. She wonders if his balance is off. He denies particular pain in his hip or knee.  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 Outpatient Medications Prior to Visit  Medication Sig Dispense Refill   aspirin EC 81 MG tablet Take 81 mg by mouth daily.     Blood Glucose Monitoring Suppl DEVI 1 each by Does not apply route in the morning, at noon, and at bedtime. May substitute to any manufacturer covered by patient's insurance. 1 each 0   empagliflozin (JARDIANCE) 25 MG TABS tablet Take 1 tablet (25 mg total) by mouth daily before breakfast. 30 tablet 3   erythromycin ophthalmic ointment SMARTSIG:In Eye(s)     ezetimibe (ZETIA) 10 MG tablet Take 1 tablet (10 mg total) by mouth daily. 90 tablet 3   glipiZIDE (GLUCOTROL XL) 10 MG 24 hr tablet Take 1 tablet (10 mg total) by mouth daily. 90 tablet 3   glucose blood (PRECISION QID TEST) test strip 1 each by Other route as needed.     Olopatadine HCl 0.2 % SOLN Apply to eye.     omeprazole (PRILOSEC) 40 MG capsule Take 40 mg by mouth 2 (two) times daily.     atorvastatin (LIPITOR) 80 MG tablet Take 80 mg by mouth daily.     hydrochlorothiazide (HYDRODIURIL) 12.5 MG tablet Take 12.5 mg by mouth daily.     levothyroxine (SYNTHROID) 88 MCG tablet Take 88 mcg by mouth daily before breakfast.  lisinopril (ZESTRIL) 20 MG tablet Take 20 mg by mouth daily.     metFORMIN (GLUCOPHAGE) 500 MG tablet Take 500 mg by mouth 2 (two) times daily with a meal.     No facility-administered medications prior to visit.   No Known Allergies Objective:   Today's Vitals   01/01/24 1012  BP: 124/66  Pulse: 80  Temp: 97.7 F (36.5 C)  TempSrc: Temporal  SpO2: 95%  Weight: 118 lb 6.4 oz (53.7 kg)  Height: 5' 3.5" (1.613 m)   Body mass index is 20.64 kg/m.   General: Well developed, well nourished. No acute distress. Extremities: Full ROM of  left hip and knee. No joint swelling or tenderness. There is clicking with   flexion/extension of the left knee. Patient's gait is antalgic and he seems to favor the left leg. No edema noted. Neuro: Romberg test shows mild swaying, but good correction of balance. There is some wekaness in the left   foot dorsiflexors. Psych: Alert and oriented. Normal mood and affect.  Health Maintenance Due  Topic Date Due   Zoster Vaccines- Shingrix (1 of 2) Never done   Medicare Annual Wellness (AWV)  10/24/2023     Assessment & Plan:   Problem List Items Addressed This Visit       Cardiovascular and Mediastinum   Essential hypertension - Primary   Blood pressure is in good control. Continue HCTZ 12.5 mg daily and lisinopril 20 mg daily.      Relevant Medications   atorvastatin (LIPITOR) 80 MG tablet   hydrochlorothiazide (HYDRODIURIL) 12.5 MG tablet   lisinopril (ZESTRIL) 20 MG tablet     Endocrine   Acquired hypothyroidism   TSH is at goal. Continue levothyroxine 88 mcg daily.      Relevant Medications   levothyroxine (SYNTHROID) 88 MCG tablet   Type 2 diabetes mellitus with diabetic polyneuropathy (HCC)   Family reports blood sugars are now 120-150, which is much improved from before. Continue metformin 500 mg 2 tabs bid, glipizide 10 mg daily and empagliflozin 25 mg daily. I will renew the referral for diabetic teaching and provide patient's daughter with the number to call regarding this. I will check his A1c today.      Relevant Medications   atorvastatin (LIPITOR) 80 MG tablet   metFORMIN (GLUCOPHAGE) 500 MG tablet   lisinopril (ZESTRIL) 20 MG tablet   Other Relevant Orders   Glucose, random (Completed)   Hemoglobin A1c (Completed)   Amb Referral to Nutrition and Diabetic Education     Other   Hyperlipidemia (Chronic)   Continue atorvastatin 80 mg daily and ezetimibe 10 mg daily. I will check lipids today.      Relevant Medications   atorvastatin (LIPITOR) 80 MG tablet    hydrochlorothiazide (HYDRODIURIL) 12.5 MG tablet   lisinopril (ZESTRIL) 20 MG tablet   Other Relevant Orders   Lipid panel (Completed)   Antalgic gait   This may be multifactorial. I will check x-rays of his left hip and ankle. Might consider neuro referral in light of weakness in the ankle dorsiflexors and mildly positive Romberg's sign.      Relevant Orders   DG Hip Unilat W OR W/O Pelvis Min 4 Views Left   DG Knee Complete 4 Views Right   Weight loss, unintentional   Likely due to uncontrolled diabetes. We will continue to focus on improved glycemic control.       Return in about 3 months (around 04/01/2024) for Reassessment.   Bertram Millard  Veto Kemps, MD

## 2024-01-01 NOTE — Addendum Note (Signed)
 Addended by: Loyola Mast on: 01/01/2024 02:37 PM   Modules accepted: Orders

## 2024-01-01 NOTE — Assessment & Plan Note (Signed)
 Continue atorvastatin 80 mg daily and ezetimibe 10 mg daily. I will check lipids today.

## 2024-01-01 NOTE — Telephone Encounter (Signed)
 Copied from CRM [id number]. Topic: General - Other >> Jan 01, 2024 10:52 AM [place] E wrote: Reason for CRM: Kya from [facility] called regarding patient's referral that was put in today 4/9. Kya stated that they are only required to call the patient 3 times to get an appointment set up, which they did call him 3 times for the referral that was placed on 2/25, and they will call him again regarding the new referral that was placed. Kya also stated that they are leaving voice messages in [place] and are leaving it up to the patient to call them back after they have attempted 3 times. Kya just wanted to make patient's PCP aware.

## 2024-01-01 NOTE — Telephone Encounter (Signed)
 Gave patient's daughter the number and she will call and schedule the appointment. Dm/cma

## 2024-01-01 NOTE — Assessment & Plan Note (Signed)
 Blood pressure is in good control. Continue HCTZ 12.5 mg daily and lisinopril 20 mg daily.

## 2024-01-09 ENCOUNTER — Other Ambulatory Visit: Payer: Self-pay | Admitting: Family Medicine

## 2024-02-13 ENCOUNTER — Other Ambulatory Visit: Payer: Self-pay | Admitting: Family Medicine

## 2024-03-19 ENCOUNTER — Emergency Department (HOSPITAL_BASED_OUTPATIENT_CLINIC_OR_DEPARTMENT_OTHER)
Admission: EM | Admit: 2024-03-19 | Discharge: 2024-03-20 | Disposition: A | Discharge status: Home / Self Care | Attending: Emergency Medicine | Admitting: Emergency Medicine

## 2024-03-19 ENCOUNTER — Other Ambulatory Visit: Payer: Self-pay

## 2024-03-19 ENCOUNTER — Emergency Department (HOSPITAL_BASED_OUTPATIENT_CLINIC_OR_DEPARTMENT_OTHER)

## 2024-03-19 ENCOUNTER — Ambulatory Visit: Payer: Self-pay | Admitting: *Deleted

## 2024-03-19 DIAGNOSIS — Z7984 Long term (current) use of oral hypoglycemic drugs: Secondary | ICD-10-CM | POA: Insufficient documentation

## 2024-03-19 DIAGNOSIS — R739 Hyperglycemia, unspecified: Secondary | ICD-10-CM | POA: Diagnosis present

## 2024-03-19 DIAGNOSIS — N179 Acute kidney failure, unspecified: Secondary | ICD-10-CM | POA: Diagnosis not present

## 2024-03-19 DIAGNOSIS — E1165 Type 2 diabetes mellitus with hyperglycemia: Secondary | ICD-10-CM | POA: Diagnosis not present

## 2024-03-19 DIAGNOSIS — Z7982 Long term (current) use of aspirin: Secondary | ICD-10-CM | POA: Insufficient documentation

## 2024-03-19 LAB — I-STAT VENOUS BLOOD GAS, ED
Acid-Base Excess: 0 mmol/L (ref 0.0–2.0)
Bicarbonate: 27.2 mmol/L (ref 20.0–28.0)
Calcium, Ion: 1.2 mmol/L (ref 1.15–1.40)
HCT: 42 % (ref 39.0–52.0)
Hemoglobin: 14.3 g/dL (ref 13.0–17.0)
O2 Saturation: 35 %
Patient temperature: 97.7
Potassium: 5.5 mmol/L — ABNORMAL HIGH (ref 3.5–5.1)
Sodium: 133 mmol/L — ABNORMAL LOW (ref 135–145)
TCO2: 29 mmol/L (ref 22–32)
pCO2, Ven: 52.1 mmHg (ref 44–60)
pH, Ven: 7.324 (ref 7.25–7.43)
pO2, Ven: 23 mmHg — CL (ref 32–45)

## 2024-03-19 LAB — COMPREHENSIVE METABOLIC PANEL WITH GFR
ALT: 100 U/L — ABNORMAL HIGH (ref 0–44)
AST: 33 U/L (ref 15–41)
Albumin: 4.4 g/dL (ref 3.5–5.0)
Alkaline Phosphatase: 99 U/L (ref 38–126)
Anion gap: 16 — ABNORMAL HIGH (ref 5–15)
BUN: 30 mg/dL — ABNORMAL HIGH (ref 8–23)
CO2: 22 mmol/L (ref 22–32)
Calcium: 9.8 mg/dL (ref 8.9–10.3)
Chloride: 92 mmol/L — ABNORMAL LOW (ref 98–111)
Creatinine, Ser: 1.44 mg/dL — ABNORMAL HIGH (ref 0.61–1.24)
GFR, Estimated: 50 mL/min — ABNORMAL LOW (ref >60)
Glucose, Bld: 583 mg/dL (ref 70–99)
Potassium: 4.6 mmol/L (ref 3.5–5.1)
Sodium: 130 mmol/L — ABNORMAL LOW (ref 135–145)
Total Bilirubin: 0.5 mg/dL (ref 0.0–1.2)
Total Protein: 7.4 g/dL (ref 6.5–8.1)

## 2024-03-19 LAB — CBC WITH DIFFERENTIAL/PLATELET
Abs Immature Granulocytes: 0.02 10*3/uL (ref 0.00–0.07)
Basophils Absolute: 0.1 10*3/uL (ref 0.0–0.1)
Basophils Relative: 1 %
Eosinophils Absolute: 0.1 10*3/uL (ref 0.0–0.5)
Eosinophils Relative: 1 %
HCT: 40.6 % (ref 39.0–52.0)
Hemoglobin: 13.3 g/dL (ref 13.0–17.0)
Immature Granulocytes: 0 %
Lymphocytes Relative: 23 %
Lymphs Abs: 1.5 10*3/uL (ref 0.7–4.0)
MCH: 25.2 pg — ABNORMAL LOW (ref 26.0–34.0)
MCHC: 32.8 g/dL (ref 30.0–36.0)
MCV: 76.9 fL — ABNORMAL LOW (ref 80.0–100.0)
Monocytes Absolute: 0.5 10*3/uL (ref 0.1–1.0)
Monocytes Relative: 8 %
Neutro Abs: 4.3 10*3/uL (ref 1.7–7.7)
Neutrophils Relative %: 67 %
Platelets: 294 10*3/uL (ref 150–400)
RBC: 5.28 MIL/uL (ref 4.22–5.81)
RDW: 13.2 % (ref 11.5–15.5)
WBC: 6.4 10*3/uL (ref 4.0–10.5)
nRBC: 0 % (ref 0.0–0.2)

## 2024-03-19 LAB — CBG MONITORING, ED: Glucose-Capillary: 554 mg/dL (ref 70–99)

## 2024-03-19 MED ORDER — LACTATED RINGERS IV BOLUS
1000.0000 mL | Freq: Once | INTRAVENOUS | Status: AC
  Administered 2024-03-19: 1000 mL via INTRAVENOUS

## 2024-03-19 MED ORDER — INSULIN ASPART 100 UNIT/ML IJ SOLN
10.0000 [IU] | Freq: Once | INTRAMUSCULAR | Status: AC
  Administered 2024-03-19: 10 [IU] via SUBCUTANEOUS

## 2024-03-19 NOTE — Telephone Encounter (Signed)
 Copied from CRM [id number]. Topic: Clinical - Red Word Triage >> Mar 19, 2024 10:43 AM Mercedes MATSU wrote: Red Word that prompted transfer to Nurse Triage: Patients daughter [facility], called in stating that patients blood pressure is in the 400's and has been that way since yesterday. Reason for Disposition  Blood glucose > 400 mg/dL (77.7 mmol/L)  Answer Assessment - Initial Assessment Questions 1. BLOOD GLUCOSE: What is your blood glucose level?      Daughter Simmaly calling in.    His blood sugar was 400 yesterday.   This morning it's still 400.   Should I take him to the ED?    I'm at work so I'm not with my father. He has a slight fever.   He is not dizzy or having shortness of breath or I would have taken him to the ED. He is on Jardiance .      I'm the one who fixes his medication.   I don't know if he is taking his medication or not correctly.    He is answering my questions properly this morning.    He is acting normally when I spoke with him.   I told my mother to keep an eye on him for symptoms.   If he started having symptoms to take him to the ED.     2. ONSET: When did you check the blood glucose?     This morning it was 400.    On Tues. He ate some fast food.   He drank a Coke.   He does not exercise.   I'm trying to get him to do my treadmill but he doesn't.   The food he is eating is not correct.    He is going to the dietitian but he is not eating properly.    I'm not with him 24 hours so I don't know what all he is eating.   He is c/o being tired.    3. USUAL RANGE: What is your glucose level usually? (e.g., usual fasting morning value, usual evening value)     130s and 140s.     In our culture our food is heavy with salt and sodium.  He cooks himself and he uses a lot of salt and stuff.    I'm working so I can't control him.   He is trying but it's depressing to him.      4. KETONES: Do you check for ketones (urine or blood test strips)? If Yes, ask: What  does the test show now?      Not asked 5. TYPE 1 or 2:  Do you know what type of diabetes you have?  (e.g., Type 1, Type 2, Gestational; doesn't know)      Type 2 6. INSULIN: Do you take insulin? What type of insulin(s) do you use? What is the mode of delivery? (syringe, pen; injection or pump)?      No    not now.   They took him off of it a long time ago. 7. DIABETES PILLS: Do you take any pills for your diabetes? If Yes, ask: Have you missed taking any pills recently?     Jardiance  8. OTHER SYMPTOMS: Do you have any symptoms? (e.g., fever, frequent urination, difficulty breathing, dizziness, weakness, vomiting)     No symptoms except fatigue.       9. PREGNANCY: Is there any chance you are pregnant? When was your last menstrual period?     N/A  Protocols used: Diabetes - High Blood Sugar-A-AH Daughter Simmaly called in.   She is not with her father presently however she was able to answer the triage questions.  Pt's blood sugar is 400 last night and again this morning.   Only complaint is fatigue.  He denies shortness of breath, dizziness, she mentioned he is answering her questions appropriately and he is independent.   If he develops any symptoms she will have him go to the ED. He is not eating right.  Been eating fast food more than he should.   He is having difficulty eating properly.   He cooks for himself so doesn't always follow the diabetic diet guidelines. She is seeking advice what to do for his elevated glucose of 400.    He takes Jardiance  which he is taking.  Simmaly is monitoring his medications and he seems to be taking them properly.  She can be reached at the number in the chart Simmaly [phone].   Pt does not speak english.   Simmaly does.  FYI Only or Action Required?: Action required by provider: clinical question for provider.  Patient was last seen in primary care on 01/01/2024 by Thedora Garnette HERO, MD. Called Nurse Triage reporting Blood Sugar  Problem. Symptoms began yesterday. Interventions attempted: Dietary changes. Symptoms are: rapidly worsening.  Triage Disposition: Call PCP Now  Patient/caregiver understands and will follow disposition?:             FYI Only or Action Required?: Action required by provider: clinical question for provider.  Patient was last seen in primary care on 01/01/2024 by Thedora Garnette HERO, MD. Called Nurse Triage reporting Blood Sugar Problem. Symptoms began yesterday. Interventions attempted: Dietary changes. Symptoms are: rapidly worsening Glucose 400 last night and this morning.  Triage Disposition: Call PCP Now  Patient/caregiver understands and will follow disposition?:  Yes

## 2024-03-19 NOTE — ED Triage Notes (Signed)
 Pt arrives with c/o fatigue that started few days ago. Per family, pts blood sugar hs been elevated over the past few days. Pt denies missing any doses of his diabetes meds. Pt is suppose to take metformin , glipizide , and sitagliptin  for diabetes. Pt denies n/v.

## 2024-03-19 NOTE — ED Provider Notes (Signed)
 [facility] Provider Note   CSN: [id number] Arrival date & time: 03/19/24  2021     Patient presents with: Fatigue   Demerius Christina is a 78 y.o. male.   The history is provided by the patient, a relative and medical records. Language interpreter used: Daughter acted as Engineer, technical sales, she is a Ecologist.   Angell Pauling is a 78 y.o. male who presents to the Emergency Department complaining of elevated blood sugar.  He presents to the emergency department accompanied by his daughter for elevated blood sugars that started on Monday with sugars up to 400.  He did go to urgent care today for this symptom due to being unable to get a hold of his PCP.  When he was there it was 600, which prompted ED visit.  No associate fatigue, shortness of breath, chest pain, headache, abdominal pain, nausea, vomiting, fever.  He does have some fatigue lately.  He has been doing activities such as mowing the lawn without difficulty.  He has had some dietary indiscretions recently including eating sugary foods.  He has been compliant with his medications for diabetes including metformin , glipizide  and sitagliptin     Prior to Admission medications   Medication Sig Start Date End Date Taking? Authorizing Provider  ACCU-CHEK GUIDE TEST test strip USE 1 STRIP TO CHECK GLUCOSE THREE TIMES DAILY. TEST IN THE MORNING, AT NOON, AND AT NIGHT 01/10/24   Thedora Garnette HERO, MD  Accu-Chek Softclix Lancets lancets USE 1  TO CHECK GLUCOSE THREE TIMES DAILY TEST  IN  THE  MORNING,  AT  NOON,  AND  AT  BEDTIME. 02/14/24   Thedora Garnette HERO, MD  aspirin EC 81 MG tablet Take 81 mg by mouth daily. 04/12/14   [provider]  atorvastatin  (LIPITOR) 80 MG tablet Take 1 tablet (80 mg total) by mouth daily. 01/01/24   Thedora Garnette HERO, MD  Blood Glucose Monitoring Suppl DEVI 1 each by Does not apply route in the morning, at noon, and at bedtime. May substitute to  any manufacturer covered by patient's insurance. 11/19/23   Thedora Garnette HERO, MD  empagliflozin  (JARDIANCE ) 25 MG TABS tablet Take 1 tablet (25 mg total) by mouth daily before breakfast. 11/19/23   Thedora Garnette HERO, MD  erythromycin ophthalmic ointment SMARTSIG:In Eye(s) 12/30/23   [provider]  ezetimibe  (ZETIA ) 10 MG tablet Take 1 tablet (10 mg total) by mouth daily. 10/08/23   Thedora Garnette HERO, MD  glipiZIDE  (GLUCOTROL  XL) 10 MG 24 hr tablet Take 1 tablet (10 mg total) by mouth daily. 11/19/23   Thedora Garnette HERO, MD  hydrochlorothiazide  (HYDRODIURIL ) 12.5 MG tablet Take 1 tablet (12.5 mg total) by mouth daily. 01/01/24   Thedora Garnette HERO, MD  levothyroxine  (SYNTHROID ) 88 MCG tablet Take 1 tablet (88 mcg total) by mouth daily before breakfast. 01/01/24   Thedora Garnette HERO, MD  lisinopril  (ZESTRIL ) 20 MG tablet Take 1 tablet (20 mg total) by mouth daily. 01/01/24   Thedora Garnette HERO, MD  metFORMIN  (GLUCOPHAGE ) 500 MG tablet Take 1 tablet (500 mg total) by mouth 2 (two) times daily with a meal. 01/01/24   Thedora Garnette HERO, MD  Olopatadine HCl 0.2 % SOLN Apply to eye. 01/29/20   [provider]  omeprazole (PRILOSEC) 40 MG capsule Take 40 mg by mouth 2 (two) times daily.    [provider]  SITagliptin  25 MG TABS Take 25 mg by mouth daily at 12 noon.  01/01/24   Thedora Garnette HERO, MD    Allergies: Patient has no known allergies.    Review of Systems  All other systems reviewed and are negative.   Updated Vital Signs BP (!) 104/56   Pulse 78   Temp 98 F (36.7 C) (Oral)   Resp 18   Wt 48.1 kg   SpO2 97%   BMI 18.48 kg/m   Physical Exam Vitals and nursing note reviewed.  Constitutional:      Appearance: He is well-developed.  HENT:     Head: Normocephalic and atraumatic.   Cardiovascular:     Rate and Rhythm: Normal rate and regular rhythm.     Heart sounds: No murmur heard. Pulmonary:     Effort: Pulmonary effort is normal. No respiratory distress.     Breath sounds: Normal  breath sounds.  Abdominal:     Palpations: Abdomen is soft.     Tenderness: There is no abdominal tenderness. There is no guarding or rebound.   Musculoskeletal:        General: No swelling or tenderness.     Comments: No ulcers or wounds to the feet.  2+ DP pulses.   Skin:    General: Skin is warm and dry.   Neurological:     Mental Status: He is alert and oriented to person, place, and time.   Psychiatric:        Behavior: Behavior normal.     (all labs ordered are listed, but only abnormal results are displayed) Labs Reviewed  URINALYSIS, ROUTINE W REFLEX MICROSCOPIC - Abnormal; Notable for the following components:      Result Value   Glucose, UA >=500 (*)    Ketones, ur 15 (*)    All other components within normal limits  CBC WITH DIFFERENTIAL/PLATELET - Abnormal; Notable for the following components:   MCV 76.9 (*)    MCH 25.2 (*)    All other components within normal limits  COMPREHENSIVE METABOLIC PANEL WITH GFR - Abnormal; Notable for the following components:   Sodium 130 (*)    Chloride 92 (*)    Glucose, Bld 583 (*)    BUN 30 (*)    Creatinine, Ser 1.44 (*)    ALT 100 (*)    GFR, Estimated 50 (*)    Anion gap 16 (*)    All other components within normal limits  URINALYSIS, MICROSCOPIC (REFLEX) - Abnormal; Notable for the following components:   Bacteria, UA RARE (*)    All other components within normal limits  BASIC METABOLIC PANEL WITH GFR - Abnormal; Notable for the following components:   Glucose, Bld 251 (*)    BUN 29 (*)    Creatinine, Ser 1.26 (*)    GFR, Estimated 59 (*)    All other components within normal limits  BETA-HYDROXYBUTYRIC ACID - Abnormal; Notable for the following components:   Beta-Hydroxybutyric Acid 0.68 (*)    All other components within normal limits  BASIC METABOLIC PANEL WITH GFR - Abnormal; Notable for the following components:   Glucose, Bld 148 (*)    BUN 27 (*)    All other components within normal limits  CBG  MONITORING, ED - Abnormal; Notable for the following components:   Glucose-Capillary 554 (*)    All other components within normal limits  CBG MONITORING, ED - Abnormal; Notable for the following components:   Glucose-Capillary 356 (*)    All other components within normal limits  I-STAT VENOUS BLOOD GAS, ED -  Abnormal; Notable for the following components:   pO2, Ven 23 (*)    Sodium 133 (*)    Potassium 5.5 (*)    All other components within normal limits  CBG MONITORING, ED - Abnormal; Notable for the following components:   Glucose-Capillary 287 (*)    All other components within normal limits  CBG MONITORING, ED - Abnormal; Notable for the following components:   Glucose-Capillary 175 (*)    All other components within normal limits  CBG MONITORING, ED - Abnormal; Notable for the following components:   Glucose-Capillary 136 (*)    All other components within normal limits  CBG MONITORING, ED - Abnormal; Notable for the following components:   Glucose-Capillary 142 (*)    All other components within normal limits  CBG MONITORING, ED - Abnormal; Notable for the following components:   Glucose-Capillary 160 (*)    All other components within normal limits    EKG: None  Radiology: US  Abdomen Limited RUQ (LIVER/GB) Result Date: 03/20/2024 CLINICAL DATA:  402009 LFTs abnormal [id number].  Fatigue EXAM: ULTRASOUND ABDOMEN LIMITED RIGHT UPPER QUADRANT COMPARISON:  None Available. FINDINGS: Gallbladder: No gallstones or wall thickening visualized. No sonographic Murphy sign noted by sonographer. Common bile duct: Diameter: 4 mm. Liver: No focal lesion identified. Within normal limits in parenchymal echogenicity. Portal vein is patent on color Doppler imaging with normal direction of blood flow towards the liver. Other: None. IMPRESSION: Unremarkable right upper quadrant ultrasound. Electronically Signed   By: Morgane  Naveau M.D.   On: 03/20/2024 00:46     Procedures  CRITICAL  CARE Performed by: Almarie Burner   Total critical care time: 40 minutes  Critical care time was exclusive of separately billable procedures and treating other patients.  Critical care was necessary to treat or prevent imminent or life-threatening deterioration.  Critical care was time spent personally by me on the following activities: development of treatment plan with patient and/or surrogate as well as nursing, discussions with consultants, evaluation of patient's response to treatment, examination of patient, obtaining history from patient or surrogate, ordering and performing treatments and interventions, ordering and review of laboratory studies, ordering and review of radiographic studies, pulse oximetry and re-evaluation of patient's condition.  Medications Ordered in the ED  insulin  regular, human (MYXREDLIN) 100 units/ 100 mL infusion (0 Units/hr Intravenous Stopped 03/20/24 0504)  lactated ringers  infusion (0 mLs Intravenous Stopped 03/20/24 0258)  dextrose 5 % in lactated ringers  infusion (0 mLs Intravenous Stopped 03/20/24 0630)  dextrose 50 % solution 0-50 mL (has no administration in time range)  lactated ringers  bolus 1,000 mL (0 mLs Intravenous Stopped 03/20/24 0052)  insulin  aspart (novoLOG ) injection 10 Units (10 Units Subcutaneous Given 03/19/24 2349)  potassium chloride SA (KLOR-CON M) CR tablet 40 mEq (40 mEq Oral Given 03/20/24 0327)                                    Medical Decision Making Amount and/or Complexity of Data Reviewed Labs: ordered. Radiology: ordered.  Risk Prescription drug management.   Patient with history of diabetes here for evaluation of elevated blood sugars fatigue.  He is nontoxic-appearing on evaluation with no focal neurologic deficits.  Glucose is significantly high.  BMP is significant for elevation in BUN and creatinine consistent with AKI.  He was treated with IV fluid hydration with only partial improvement in his blood sugars.  He  does have elevated anion  gap, trace ketones in his urine.  He was started on glucose stabilizer for possible euglycemic/early DKA.  Beta hydroxybutyric acid was sent.  Venous gases unremarkable.  He was continued on glucose stabilizer with closure of his anion gap.  Beta hydroxybutyric acid took many hours to return, it did return minimally elevated.  His gap did close and he was discontinued off the insulin .  He was able to tolerate p.o. in the emergency department.  Renal function normalized with IV fluids.  Feel patient is stable for discharge home.  Discussed continuing his diabetic medications.  His blood pressure is on the low side of normal.  Recommend discontinuing his lisinopril /HCTZ at this time.  Discussed PCP follow-up for recheck, may need medication changes for his diabetes.  Return precautions discussed.  No evidence of acute infectious process.  Presentation is not consistent with sepsis, acute abdomen.     Final diagnoses:  AKI (acute kidney injury) [facility])  Hyperglycemia    ED Discharge Orders     None          Griselda Norris, MD 03/20/24 [phone]

## 2024-03-19 NOTE — ED Notes (Signed)
 i-STAT venous blood gas results not crossing over into EPIC.  Patient results are as follows:   pH: 7.32 pCO2: 52.1 pO2:  23 HCO3: 27.2  Results given to Dr. Griselda, MD

## 2024-03-20 ENCOUNTER — Telehealth: Payer: Self-pay

## 2024-03-20 DIAGNOSIS — R739 Hyperglycemia, unspecified: Secondary | ICD-10-CM | POA: Diagnosis present

## 2024-03-20 LAB — URINALYSIS, ROUTINE W REFLEX MICROSCOPIC
Bilirubin Urine: NEGATIVE
Glucose, UA: >=500 mg/dL — AB
Hgb urine dipstick: NEGATIVE
Ketones, ur: 15 mg/dL — AB
Leukocytes,Ua: NEGATIVE
Nitrite: NEGATIVE
Protein, ur: NEGATIVE mg/dL
Specific Gravity, Urine: 1.01 (ref 1.005–1.030)
pH: 5.5 (ref 5.0–8.0)

## 2024-03-20 LAB — CBG MONITORING, ED
Glucose-Capillary: 136 mg/dL — ABNORMAL HIGH (ref 70–99)
Glucose-Capillary: 142 mg/dL — ABNORMAL HIGH (ref 70–99)
Glucose-Capillary: 160 mg/dL — ABNORMAL HIGH (ref 70–99)
Glucose-Capillary: 175 mg/dL — ABNORMAL HIGH (ref 70–99)
Glucose-Capillary: 287 mg/dL — ABNORMAL HIGH (ref 70–99)
Glucose-Capillary: 356 mg/dL — ABNORMAL HIGH (ref 70–99)

## 2024-03-20 LAB — BASIC METABOLIC PANEL WITH GFR
Anion gap: 14 (ref 5–15)
Anion gap: 10 (ref 5–15)
BUN: 29 mg/dL — ABNORMAL HIGH (ref 8–23)
BUN: 27 mg/dL — ABNORMAL HIGH (ref 8–23)
CO2: 25 mmol/L (ref 22–32)
CO2: 27 mmol/L (ref 22–32)
Calcium: 9.8 mg/dL (ref 8.9–10.3)
Calcium: 9.6 mg/dL (ref 8.9–10.3)
Chloride: 99 mmol/L (ref 98–111)
Chloride: 103 mmol/L (ref 98–111)
Creatinine, Ser: 1.26 mg/dL — ABNORMAL HIGH (ref 0.61–1.24)
Creatinine, Ser: 1.17 mg/dL (ref 0.61–1.24)
GFR, Estimated: 59 mL/min — ABNORMAL LOW (ref >60)
GFR, Estimated: >60 mL/min (ref >60)
Glucose, Bld: 251 mg/dL — ABNORMAL HIGH (ref 70–99)
Glucose, Bld: 148 mg/dL — ABNORMAL HIGH (ref 70–99)
Potassium: 3.5 mmol/L (ref 3.5–5.1)
Potassium: 3.7 mmol/L (ref 3.5–5.1)
Sodium: 138 mmol/L (ref 135–145)
Sodium: 140 mmol/L (ref 135–145)

## 2024-03-20 LAB — BETA-HYDROXYBUTYRIC ACID: Beta-Hydroxybutyric Acid: 0.68 mmol/L — ABNORMAL HIGH (ref 0.05–0.27)

## 2024-03-20 LAB — URINALYSIS, MICROSCOPIC (REFLEX)

## 2024-03-20 MED ORDER — INSULIN REGULAR(HUMAN) IN NACL 100-0.9 UT/100ML-% IV SOLN
INTRAVENOUS | Status: DC
  Administered 2024-03-20: 6.5 [IU]/h via INTRAVENOUS
  Filled 2024-03-20: qty 100

## 2024-03-20 MED ORDER — DEXTROSE IN LACTATED RINGERS 5 % IV SOLN: INTRAVENOUS | Status: DC

## 2024-03-20 MED ORDER — LACTATED RINGERS IV SOLN: INTRAVENOUS | Status: DC

## 2024-03-20 MED ORDER — DEXTROSE 50 % IV SOLN: 0.0000 mL | INTRAVENOUS | Status: DC | PRN

## 2024-03-20 MED ORDER — POTASSIUM CHLORIDE CRYS ER 20 MEQ PO TBCR
40.0000 meq | EXTENDED_RELEASE_TABLET | Freq: Once | ORAL | Status: AC
  Administered 2024-03-20: 40 meq via ORAL
  Filled 2024-03-20: qty 2

## 2024-03-20 NOTE — Transitions of Care (Post Inpatient/ED Visit) (Signed)
   03/20/2024  Name: Garrett Rice MRN: [id number] DOB: [birth year]  Today's TOC FU Call Status: Today's TOC FU Call Status:: Successful TOC FU Call Completed TOC FU Call Complete Date: 03/20/24 Patient's Name and Date of Birth confirmed.  Transition Care Management Follow-up Telephone Call Date of Discharge: 03/19/24 Discharge Facility: [facility] Type of Discharge: Emergency Department Reason for ED Visit: Other: How have you been since you were released from the hospital?: Better Any questions or concerns?: No  Items Reviewed: Did you receive and understand the discharge instructions provided?: Yes Medications obtained,verified, and reconciled?: Yes (Medications Reviewed) Any new allergies since your discharge?: No Dietary orders reviewed?: NA Do you have support at home?: No  Medications Reviewed Today: Medications Reviewed Today   Medications were not reviewed in this encounter     Home Care and Equipment/Supplies: Were Home Health Services Ordered?: NA Any new equipment or medical supplies ordered?: NA  Functional Questionnaire: Do you need assistance with bathing/showering or dressing?: No Do you need assistance with meal preparation?: No Do you need assistance with eating?: No Do you have difficulty maintaining continence: No Do you need assistance with getting out of bed/getting out of a chair/moving?: No Do you have difficulty managing or taking your medications?: No  Follow up appointments reviewed: PCP Follow-up appointment confirmed?: Yes Date of PCP follow-up appointment?: 04/01/24 Follow-up Provider: Dr. Thedora [facility] Follow-up appointment confirmed?: NA Do you need transportation to your follow-up appointment?: No Do you understand care options if your condition(s) worsen?: Yes-patient verbalized understanding    SIGNATURE Keila Turan D, CMA

## 2024-03-20 NOTE — Telephone Encounter (Signed)
Left VM to rtn call. Dm/cma       

## 2024-03-20 NOTE — Discharge Instructions (Addendum)
 Stop taking your lisinopril  and hydrochlorothiazide  for now. Continue taking your medications for diabetes.  Drink plenty of fluids.

## 2024-03-20 NOTE — Telephone Encounter (Unsigned)
 Copied from CRM [id number]. Topic: General - Other >> Mar 20, 2024 11:06 AM Robinson H wrote: Reason for CRM: Patients daughter returning call, states the missed a call. From notes looks like LaVon reached out, please reach back out, thanks. Agent checked provider schedule and nothing until July 21st,   [place] [phone]

## 2024-03-20 NOTE — Telephone Encounter (Signed)
 Returned call to daughter to complete TOC

## 2024-03-20 NOTE — Telephone Encounter (Signed)
 Spoke to patient daughter and they did take him to the hospital for elevated BS last night.  His Glipizide  is ready to be picked up at the pharmacy. Dm/cma

## 2024-03-20 NOTE — Transitions of Care (Post Inpatient/ED Visit) (Signed)
   03/20/2024  Name: Garrett Rice MRN: [id number] DOB: 11-02-1945  Today's TOC FU Call Status: Today's TOC FU Call Status:: Unsuccessful Call (1st Attempt) Unsuccessful Call (1st Attempt) Date: 03/20/24  Attempted to reach the patient regarding the most recent Inpatient/ED visit.  Follow Up Plan: Additional outreach attempts will be made to reach the patient to complete the Transitions of Care (Post Inpatient/ED visit) call.   Signature  Mackena Plummer D, CMA

## 2024-03-20 NOTE — ED Notes (Signed)
 D/c insulin  per Griselda MD.

## 2024-03-28 ENCOUNTER — Other Ambulatory Visit: Payer: Self-pay | Admitting: Family Medicine

## 2024-03-28 DIAGNOSIS — E1142 Type 2 diabetes mellitus with diabetic polyneuropathy: Secondary | ICD-10-CM

## 2024-04-01 ENCOUNTER — Encounter: Payer: Self-pay | Admitting: Family Medicine

## 2024-04-01 ENCOUNTER — Ambulatory Visit: Admitting: Family Medicine

## 2024-04-01 ENCOUNTER — Ambulatory Visit: Payer: Self-pay | Admitting: Family Medicine

## 2024-04-01 ENCOUNTER — Other Ambulatory Visit: Payer: Self-pay | Admitting: Family Medicine

## 2024-04-01 VITALS — BP 126/78 | HR 77 | Temp 97.0°F | Ht 63.5 in | Wt 111.8 lb

## 2024-04-01 DIAGNOSIS — Z7984 Long term (current) use of oral hypoglycemic drugs: Secondary | ICD-10-CM

## 2024-04-01 DIAGNOSIS — E1142 Type 2 diabetes mellitus with diabetic polyneuropathy: Secondary | ICD-10-CM

## 2024-04-01 DIAGNOSIS — N179 Acute kidney failure, unspecified: Secondary | ICD-10-CM

## 2024-04-01 LAB — BASIC METABOLIC PANEL WITH GFR
BUN: 14 mg/dL (ref 6–23)
CO2: 29 meq/L (ref 19–32)
Calcium: 9.7 mg/dL (ref 8.4–10.5)
Chloride: 99 meq/L (ref 96–112)
Creatinine, Ser: 0.9 mg/dL (ref 0.40–1.50)
GFR: 82.64 mL/min
Glucose, Bld: 244 mg/dL — ABNORMAL HIGH (ref 70–99)
Potassium: 3.9 meq/L (ref 3.5–5.1)
Sodium: 136 meq/L (ref 135–145)

## 2024-04-01 LAB — GLUCOSE, POCT (MANUAL RESULT ENTRY): POC Glucose: 244 mg/dL — AB (ref 70–99)

## 2024-04-01 LAB — HEMOGLOBIN A1C: Hgb A1c MFr Bld: 15.8 % — ABNORMAL HIGH (ref 4.6–6.5)

## 2024-04-01 MED ORDER — METFORMIN HCL 1000 MG PO TABS: 1000.0000 mg | ORAL_TABLET | Freq: Two times a day (BID) | ORAL | 3 refills | Status: AC

## 2024-04-01 NOTE — Progress Notes (Signed)
 [facility] [address] [place] [address] Dept: [phone] Dept Fax: [phone]  Chronic Care Office Visit  Subjective:    Patient ID: Murrell Dome, male    DOB: 10-08-1945, 78 y.o..   MRN: [id number]  Chief Complaint  Patient presents with   Hypertension    3 month f/u HTN/DM.     Medical Interpreter: Simmaly  History of Present Illness:  Patient is in today for reassessment of chronic medical issues.  Mr. Banwart has Type 2 diabetes. He is managed on metformin  500 mg 2 tabs twice daily, glipizide  10 mg daily, empagliflozin  (Jardiance ) 25 mg daily, and sitagliptin  25 mg daily (added at last visit). He has had unintended weight loss, being down 19 lbs since Jan. and 37 lbs in the past 4 years. His last A1c was > 14. Mr. Clingerman was seen at [facility] on 03/19/2024 with hyperglycemia (glucose = 554) and AKI. He was managed with IV fluids and insulin . His daughter notes that he often eats starch foods, esp. sticky rice. She struggles with Shun's wife and other family about giving him starches and sugary foods.   Mr. Pieper has hypertension. He is managed on HCTZ 12.5 mg daily and lisinopril  20 mg dialy.   Mr. Lattanzio has hyperlipidemia. He is managed on atorvastatin  80 mg daily and ezetimibe  10 mg daily.   Mr. Chittum has a history of hypothyroidism. He is managed on levothyroxine  88 mcg daily.   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 No pertinent family history. Outpatient Medications Prior to Visit  Medication Sig Dispense Refill   ACCU-CHEK GUIDE TEST test strip USE 1 STRIP TO CHECK GLUCOSE THREE TIMES DAILY. TEST IN THE MORNING, AT NOON, AND AT NIGHT 100 each 0   Accu-Chek Softclix Lancets lancets USE 1  TO CHECK GLUCOSE THREE TIMES DAILY TEST  IN  THE  MORNING,  AT  NOON,  AND  AT  BEDTIME. 100 each 0   aspirin EC 81 MG tablet Take 81 mg by mouth daily.     atorvastatin  (LIPITOR) 80 MG tablet Take 1 tablet (80 mg total) by mouth daily. 90 tablet 3   Blood Glucose Monitoring Suppl DEVI 1 each by Does not apply route in the morning, at noon, and at bedtime. May substitute to any manufacturer covered by patient's insurance. 1 each 0   empagliflozin  (JARDIANCE ) 25 MG TABS tablet Take 1 tablet (25 mg total) by mouth daily before breakfast. 30 tablet 3   erythromycin ophthalmic ointment SMARTSIG:In Eye(s)     ezetimibe  (ZETIA ) 10 MG tablet Take 1 tablet (10 mg total) by mouth daily. 90 tablet 3   glipiZIDE  (GLUCOTROL  XL) 10 MG 24 hr tablet Take 1 tablet (10 mg total) by mouth daily. 90 tablet 3   hydrochlorothiazide  (HYDRODIURIL ) 12.5 MG tablet Take 1 tablet (  12.5 mg total) by mouth daily. 90 tablet 3   levothyroxine  (SYNTHROID ) 88 MCG tablet Take 1 tablet (88 mcg total) by mouth daily before breakfast. 90 tablet 3   lisinopril  (ZESTRIL ) 20 MG tablet Take 1 tablet (20 mg total) by mouth daily. 90 tablet 3   Olopatadine HCl 0.2 % SOLN Apply to eye.     omeprazole (PRILOSEC) 40 MG capsule Take 40 mg by mouth 2 (two) times daily.     SITagliptin  25 MG TABS Take 25 mg by mouth daily at 12 noon. 90 tablet 3   metFORMIN  (GLUCOPHAGE ) 500 MG tablet Take 1 tablet  (500 mg total) by mouth 2 (two) times daily with a meal. 180 tablet 3   No facility-administered medications prior to visit.   No Known Allergies Objective:   Today's Vitals   04/01/24 1006  BP: 126/78  Pulse: 77  Temp: (!) 97 F (36.1 C)  TempSrc: Temporal  SpO2: 98%  Weight: 111 lb 12.8 oz (50.7 kg)  Height: 5' 3.5 (1.613 m)   Body mass index is 19.49 kg/m.   General: Well developed, well nourished. No acute distress. Lungs: Clear to auscultation bilaterally. No wheezing, rales or rhonchi. CV: RRR without murmurs or rubs. Pulses 2+ bilaterally. Skin: Warm and dry. Normal skin turgor. Psych: Alert and oriented. Normal mood and affect.  Health Maintenance Due  Topic Date Due   Diabetic kidney evaluation - Urine ACR  Never done   Zoster Vaccines- Shingrix (1 of 2) Never done   COVID-19 Vaccine (3 - 2024-25 season) 05/26/2023   Medicare Annual Wellness (AWV)  10/24/2023   Lab Results    Latest Ref Rng & Units 03/19/2024   11:36 PM 03/19/2024    8:50 PM  CBC  WBC 4.0 - 10.5 K/uL  6.4   Hemoglobin 13.0 - 17.0 g/dL 85.6  86.6   Hematocrit 39.0 - 52.0 % 42.0  40.6   Platelets 150 - 400 K/uL  294       Latest Ref Rng & Units 03/20/2024    5:02 AM 03/20/2024    1:50 AM 03/19/2024   11:36 PM  CMP  Glucose 70 - 99 mg/dL 851  748    BUN 8 - 23 mg/dL 27  29    Creatinine 9.38 - 1.24 mg/dL 8.82  8.73    Sodium 864 - 145 mmol/L 140  138  133   Potassium 3.5 - 5.1 mmol/L 3.7  3.5  5.5   Chloride 98 - 111 mmol/L 103  99    CO2 22 - 32 mmol/L 27  25    Calcium  8.9 - 10.3 mg/dL 9.6  9.8     Last lipids Lab Results  Component Value Date   CHOL 111 01/01/2024   HDL 43.60 01/01/2024   LDLCALC 45 01/01/2024   TRIG 110.0 01/01/2024   CHOLHDL 3 01/01/2024   Lab Results  Component Value Date   HGBA1C 14.4 (H) 01/01/2024   HGBA1C >14.0 (H) 10/08/2023     Assessment & Plan:   Problem List Items Addressed This Visit       Endocrine   Type 2 diabetes mellitus with  diabetic polyneuropathy (HCC) - Primary   Diabetes control is still not to goal. Suspect ongoing weight loss due to this. I will increase his metformin  to 1,000 mg bid and continue glipizide  10 mg daily, empagliflozin  25 mg daily, and sitagliptin  25 mg daily. I will check his A1c today. I asked his daughter to send  me the log of his fasting glucose levels in 2 weeks.      Relevant Medications   metFORMIN  (GLUCOPHAGE ) 1000 MG tablet   Other Relevant Orders   POCT Glucose (CBG) (Completed)   Basic metabolic panel with GFR   Hemoglobin A1c   Other Visit Diagnoses       AKI (acute kidney injury) (HCC)       Likely due to dehydration from hyperglycemia. I will reassess his kidney function today.   Relevant Orders   Basic metabolic panel with GFR       Return in about 4 weeks (around 04/29/2024) for Reassessment.   Garnette CHRISTELLA Simpler, MD

## 2024-04-01 NOTE — Assessment & Plan Note (Signed)
 Diabetes control is still not to goal. Suspect ongoing weight loss due to this. I will increase his metformin  to 1,000 mg bid and continue glipizide  10 mg daily, empagliflozin  25 mg daily, and sitagliptin  25 mg daily. I will check his A1c today. I asked his daughter to send me the log of his fasting glucose levels in 2 weeks.

## 2024-04-01 NOTE — Telephone Encounter (Unsigned)
 Copied from CRM [id number]. Topic: Clinical - Medication Refill >> Apr 01, 2024  5:20 PM Drema MATSU wrote: Medication: empagliflozin  (JARDIANCE ) 25 MG TABS tablet  Has the patient contacted their pharmacy? Yes (Agent: If no, request that the patient contact the pharmacy for the refill. If patient does not wish to contact the pharmacy document the reason why and proceed with request.) no more refills left  (Agent: If yes, when and what did the pharmacy advise?)  This is the patient's preferred pharmacy:  [facility] [address] [place], [place] - [address] [place] [address] Phone: [phone] Fax: [phone]  Is this the correct pharmacy for this prescription? Yes If no, delete pharmacy and type the correct one.   Has the prescription been filled recently? Yes  Is the patient out of the medication? No 1 left   Has the patient been seen for an appointment in the last year OR does the patient have an upcoming appointment? Yes  Can we respond through MyChart? No  Agent: Please be advised that Rx refills may take up to 3 business days. We ask that you follow-up with your pharmacy.

## 2024-04-01 NOTE — Patient Instructions (Signed)
 Send Dr. Thedora blood sugar log in 2 weeks

## 2024-04-02 MED ORDER — EMPAGLIFLOZIN 25 MG PO TABS: 25.0000 mg | ORAL_TABLET | Freq: Every day | ORAL | 3 refills | Status: DC

## 2024-05-05 ENCOUNTER — Telehealth: Payer: Self-pay | Admitting: Family Medicine

## 2024-05-05 ENCOUNTER — Ambulatory Visit: Admitting: Family Medicine

## 2024-05-05 ENCOUNTER — Encounter: Payer: Self-pay | Admitting: Family Medicine

## 2024-05-05 VITALS — BP 110/66 | HR 77 | Temp 97.4°F | Ht 63.5 in | Wt 111.2 lb

## 2024-05-05 DIAGNOSIS — Z794 Long term (current) use of insulin: Secondary | ICD-10-CM | POA: Diagnosis not present

## 2024-05-05 DIAGNOSIS — E1142 Type 2 diabetes mellitus with diabetic polyneuropathy: Secondary | ICD-10-CM | POA: Diagnosis not present

## 2024-05-05 DIAGNOSIS — Z7984 Long term (current) use of oral hypoglycemic drugs: Secondary | ICD-10-CM

## 2024-05-05 LAB — GLUCOSE, POCT (MANUAL RESULT ENTRY): POC Glucose: 296 mg/dL — AB (ref 70–99)

## 2024-05-05 MED ORDER — ACCU-CHEK SOFTCLIX LANCETS MISC: 0 refills | Status: DC

## 2024-05-05 MED ORDER — ACCU-CHEK GUIDE TEST VI STRP: ORAL_STRIP | 3 refills | Status: AC

## 2024-05-05 MED ORDER — INSULIN GLARGINE 100 UNIT/ML SOLOSTAR PEN: 10.0000 [IU] | PEN_INJECTOR | Freq: Every day | SUBCUTANEOUS | 1 refills | Status: DC

## 2024-05-05 NOTE — Telephone Encounter (Signed)
 Copied from CRM [id number]. Topic: Clinical - Prescription Issue >> May 05, 2024  2:55 PM Turkey A wrote: Reason for CRM: [facility] pharmacy called regarding Accu-Chek Softclix Lancets lancets-needs frequency and how often. Please call Laddie [phone]

## 2024-05-05 NOTE — Progress Notes (Signed)
 [facility] [address] [place] [address] Dept: [phone] Dept Fax: [phone]  Office Visit  Subjective:    Patient ID: Garrett Rice, male    DOB: Nov 03, 1945, 78 y.o..   MRN: [id number]  Chief Complaint  Patient presents with   Diabetes    4 week f/u DM.  Average BS 110- 120 at home   Medical Interpreter: Simmaly  History of Present Illness:  Patient is in today for reassessment of his uncontrolled diabetes. Garrett Rice has Type 2 diabetes. He is managed on metformin  1000 mg twice daily, glipizide  XL 10 mg daily, empagliflozin  (Jardiance ) 25 mg daily, and sitagliptin  25 mg daily. He has had unintended weight loss, being down 19 lbs since Jan. and 37 lbs in the past 4 years. His last A1c was > 15.8. Garrett Rice daughter is working with him on portion control and reducing his consumption of sweets and sticky rice. She notes the challenges of his wife giving him too much of the wrong foods. She is also encouraging him to get out and walk more. She notes that in the past, he was on insulin , which did seem to help get his diabetes under better control.  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 No  pertinent family history. Outpatient Medications Prior to Visit  Medication Sig Dispense Refill   aspirin EC 81 MG tablet Take 81 mg by mouth daily.     atorvastatin  (LIPITOR) 80 MG tablet Take 1 tablet (80 mg total) by mouth daily. 90 tablet 3   Blood Glucose Monitoring Suppl DEVI 1 each by Does not apply route in the morning, at noon, and at bedtime. May substitute to any manufacturer covered by patient's insurance. 1 each 0   empagliflozin  (JARDIANCE ) 25 MG TABS tablet Take 1 tablet (25 mg total) by mouth daily before breakfast. 30 tablet 3   erythromycin ophthalmic ointment SMARTSIG:In Eye(s)     ezetimibe  (ZETIA ) 10 MG tablet Take 1 tablet (10 mg total) by mouth daily. 90 tablet 3   glipiZIDE  (GLUCOTROL  XL) 10 MG 24 hr tablet Take 1 tablet (10 mg total) by mouth daily. 90 tablet 3   hydrochlorothiazide  (HYDRODIURIL ) 12.5 MG tablet Take 1 tablet (12.5 mg total) by mouth daily. 90 tablet 3   levothyroxine  (SYNTHROID ) 88 MCG tablet Take 1 tablet (88 mcg total) by mouth daily before breakfast. 90 tablet 3   lisinopril  (ZESTRIL ) 20 MG tablet Take 1 tablet (20 mg total) by mouth daily. 90 tablet 3   metFORMIN  (GLUCOPHAGE ) 1000 MG tablet Take 1 tablet (1,000 mg total) by mouth 2 (two) times daily with a meal. 180 tablet 3   Olopatadine HCl 0.2 % SOLN Apply to eye.     omeprazole (PRILOSEC) 40 MG capsule Take 40 mg by mouth 2 (two) times daily.     SITagliptin  25  MG TABS Take 25 mg by mouth daily at 12 noon. 90 tablet 3   ACCU-CHEK GUIDE TEST test strip USE 1 STRIP TO CHECK GLUCOSE THREE TIMES DAILY. TEST IN THE MORNING, AT NOON, AND AT NIGHT 100 each 0   Accu-Chek Softclix Lancets lancets USE 1  TO CHECK GLUCOSE THREE TIMES DAILY TEST  IN  THE  MORNING,  AT  NOON,  AND  AT  BEDTIME. 100 each 0   No facility-administered medications prior to visit.   No Known Allergies   Objective:   Today's Vitals   05/05/24 0846  BP: 110/66  Pulse: 77  Temp: (!) 97.4 F (36.3 C)  TempSrc: Temporal   SpO2: 98%  Weight: 111 lb 3.2 oz (50.4 kg)  Height: 5' 3.5 (1.613 m)   Body mass index is 19.39 kg/m.   General: Well developed, well nourished. No acute distress. Psych: Alert and oriented. Normal mood and affect.  Health Maintenance Due  Topic Date Due   Diabetic kidney evaluation - Urine ACR  Never done   Zoster Vaccines- Shingrix (1 of 2) Never done   Medicare Annual Wellness (AWV)  10/24/2023     FSBS: 296  Assessment & Plan:   Problem List Items Addressed This Visit       Endocrine   Type 2 diabetes mellitus with diabetic polyneuropathy (HCC) - Primary   Diabetes has been uncontrolled. His daughter is working with him on improved diet and physical activity. His weight has been stable over the past month, which is encouraging. She reports his fasting glucose is 110-130. Continue metformin  1,000 mg bid, glipizide  XL 10 mg daily, empagliflozin  25 mg daily, and sitagliptin  25 mg daily. I will add insulin  glargine (Lantus ) 10 units at bedtime.      Relevant Medications   insulin  glargine (LANTUS ) 100 UNIT/ML Solostar Pen   Accu-Chek Softclix Lancets lancets   glucose blood (ACCU-CHEK GUIDE TEST) test strip   Other Relevant Orders   POCT Glucose (CBG) (Completed)    Return in about 2 months (around 07/05/2024) for Reassessment.   Garnette CHRISTELLA Simpler, MD

## 2024-05-05 NOTE — Assessment & Plan Note (Addendum)
 Diabetes has been uncontrolled. His daughter is working with him on improved diet and physical activity. His weight has been stable over the past month, which is encouraging. She reports his fasting glucose is 110-130. Continue metformin  1,000 mg bid, glipizide  XL 10 mg daily, empagliflozin  25 mg daily, and sitagliptin  25 mg daily. I will add insulin  glargine (Lantus ) 10 units at bedtime.

## 2024-05-08 MED ORDER — ACCU-CHEK SOFTCLIX LANCETS MISC: 0 refills | Status: DC

## 2024-05-08 NOTE — Addendum Note (Signed)
 Addended by: LENON ROUGHEN on: 05/08/2024 05:11 PM   Modules accepted: Orders

## 2024-06-01 ENCOUNTER — Ambulatory Visit

## 2024-06-05 ENCOUNTER — Ambulatory Visit

## 2024-06-09 ENCOUNTER — Ambulatory Visit: Payer: Self-pay

## 2024-06-09 NOTE — Telephone Encounter (Signed)
 Call placed x 1 to both Daughters in patient contacts, as the CRM didn't specify which daughter called to reports symptoms in patient. No answer for both contacts; will attempt call back at a later time.      Message from [place] C sent at 06/09/2024  3:43 PM EDT  Patient daughter called in stated patient has been having uncontrollable diarrhea , stated he believes it may be the medication but is not sure would like a callback regarding this

## 2024-06-09 NOTE — Telephone Encounter (Signed)
 FYI Only or Action Required?: FYI only for provider.  Patient was last seen in primary care on 05/05/2024 by Thedora Garnette HERO, MD.  Called Nurse Triage reporting Diarrhea.  Symptoms began a week ago.  Interventions attempted: Nothing.  Symptoms are: gradually worsening.  Triage Disposition: See Physician Within 24 Hours, No Contact Calls  Patient/caregiver understands and will follow disposition?: Yes     Reason for Disposition  [1] SEVERE diarrhea (e.g., 7 or more times / day more than normal) AND [2] present > 24 hours (1 day)  Answer Assessment - Initial Assessment Questions 1. DIARRHEA SEVERITY: How bad is the diarrhea? How many more stools have you had in the past 24 hours than normal?      several 2. ONSET: When did the diarrhea begin?      X week 3. STOOL DESCRIPTION:  How loose or watery is the diarrhea? What is the stool color? Is there any blood or mucous in the stool?     Watery loose 4. VOMITING: Are you also vomiting? If Yes, ask: How many times in the past 24 hours?      no 5. ABDOMEN PAIN: Are you having any abdomen pain? If Yes, ask: What does it feel like? (e.g., crampy, dull, intermittent, constant)      no 6. ABDOMEN PAIN SEVERITY: If present, ask: How bad is the pain?  (e.g., Scale 1-10; mild, moderate, or severe)     na 7. ORAL INTAKE: If vomiting, Have you been able to drink liquids? How much liquids have you had in the past 24 hours?      no 8. HYDRATION: Any signs of dehydration? (e.g., dry mouth [not just dry lips], too weak to stand, dizziness, new weight loss) When did you last urinate?     no 9. EXPOSURE: Have you traveled to a foreign country recently? Have you been exposed to anyone with diarrhea? Could you have eaten any food that was spoiled?     no 10. ANTIBIOTIC USE: Are you taking antibiotics now or have you taken antibiotics in the past 2 months?       no 11. OTHER SYMPTOMS: Do you have any other  symptoms? (e.g., fever, blood in stool)       no 12. PREGNANCY: Is there any chance you are pregnant? When was your last menstrual period?       na  Protocols used: [facility]

## 2024-06-09 NOTE — Telephone Encounter (Signed)
 Third attempt: This RN spoke to patient's daughter,  Tenny, who was unaware of any symptom of her father's.  She stated that she would call to check on him, but he always says that he is fine.  Attempted to call her back when unable to reach sister who reported symptoms, unable to LVM for Sino  LVM Daughter, Ellison, for return call to [phone]  Routed to [facility] grandover for follow up    Message from [place] C sent at 06/09/2024  3:43 PM EDT  Patient daughter called in stated patient has been having uncontrollable diarrhea , stated he believes it may be the medication but is not sure would like a callback regarding this

## 2024-06-09 NOTE — Telephone Encounter (Signed)
  Second attempt: Call placed x 1 to both Daughters in patient contacts, as the CRM didn't specify which daughter called to reports symptoms in patient. LVM for return call to [phone]      Message from Horace C sent at 06/09/2024  3:43 PM EDT  Patient daughter called in stated patient has been having uncontrollable diarrhea , stated he believes it may be the medication but is not sure would like a callback regarding this

## 2024-06-10 ENCOUNTER — Ambulatory Visit (INDEPENDENT_AMBULATORY_CARE_PROVIDER_SITE_OTHER): Admitting: Family Medicine

## 2024-06-10 ENCOUNTER — Encounter: Payer: Self-pay | Admitting: Family Medicine

## 2024-06-10 ENCOUNTER — Other Ambulatory Visit: Payer: Self-pay

## 2024-06-10 ENCOUNTER — Ambulatory Visit: Payer: Self-pay | Admitting: Family Medicine

## 2024-06-10 VITALS — BP 112/68 | HR 81 | Temp 98.0°F | Resp 16 | Ht 65.0 in | Wt 108.2 lb

## 2024-06-10 DIAGNOSIS — D649 Anemia, unspecified: Secondary | ICD-10-CM

## 2024-06-10 DIAGNOSIS — R195 Other fecal abnormalities: Secondary | ICD-10-CM | POA: Diagnosis not present

## 2024-06-10 DIAGNOSIS — E876 Hypokalemia: Secondary | ICD-10-CM

## 2024-06-10 LAB — COMPREHENSIVE METABOLIC PANEL WITH GFR
ALT: 34 U/L (ref 0–53)
AST: 21 U/L (ref 0–37)
Albumin: 4.4 g/dL (ref 3.5–5.2)
Alkaline Phosphatase: 91 U/L (ref 39–117)
BUN: 29 mg/dL — ABNORMAL HIGH (ref 6–23)
CO2: 26 meq/L (ref 19–32)
Calcium: 10.5 mg/dL (ref 8.4–10.5)
Chloride: 101 meq/L (ref 96–112)
Creatinine, Ser: 1.09 mg/dL (ref 0.40–1.50)
GFR: 65.58 mL/min (ref >60.00)
Glucose, Bld: 161 mg/dL — ABNORMAL HIGH (ref 70–99)
Potassium: 5.4 meq/L — ABNORMAL HIGH (ref 3.5–5.1)
Sodium: 137 meq/L (ref 135–145)
Total Bilirubin: 0.6 mg/dL (ref 0.2–1.2)
Total Protein: 7 g/dL (ref 6.0–8.3)

## 2024-06-10 LAB — CBC
HCT: 39 % (ref 39.0–52.0)
Hemoglobin: 12.5 g/dL — ABNORMAL LOW (ref 13.0–17.0)
MCHC: 32 g/dL (ref 30.0–36.0)
MCV: 81.8 fl (ref 78.0–100.0)
Platelets: 354 K/uL (ref 150.0–400.0)
RBC: 4.77 Mil/uL (ref 4.22–5.81)
RDW: 15.4 % (ref 11.5–15.5)
WBC: 6.9 K/uL (ref 4.0–10.5)

## 2024-06-10 LAB — MAGNESIUM: Magnesium: 1.6 mg/dL (ref 1.5–2.5)

## 2024-06-10 LAB — TSH: TSH: 5.36 u[IU]/mL (ref 0.35–5.50)

## 2024-06-10 NOTE — Patient Instructions (Addendum)
 Stay hydrated.  Take Metamucil or Benefiber daily.  As long as you are vomiting or having diarrhea, please drink fluids with electrolytes like Gatorade, Powerade, or Pedialyte (if you can stomach the taste). Drink these along with water mainly.   Give us  2-3 business days to get the results of your labs back.  Let me know if we get worse and I will come up with an alternative plan.    Let us  know if you need anything.

## 2024-06-10 NOTE — Progress Notes (Signed)
 Chief Complaint  Patient presents with   Diarrhea    Diarrhea    Garrett Rice is 78 y.o. male here for complaint of diarrhea. Here w daughter who helps interpret.   Duration: 2 weeks Abdominal pain? No Bleeding? No Recent travel? No Recent antibiotic use? No Sick contacts? No Fevers? No Therapies tried: none Has been drinking instant coffee which stimulates looser stools.  Has not drank the coffee today, no bowel movements today either. Metformin  and Synthroid  dosing stable, compliant w meds. Does not consume artificial sweeteners.   Past Medical History:  Diagnosis Date   Diabetes mellitus without complication (HCC)    Hyperlipidemia     BP 112/68 (BP Location: Left Arm, Patient Position: Sitting)   Pulse 81   Temp 98 F (36.7 C) (Oral)   Resp 16   Ht 5' 5 (1.651 m)   Wt 108 lb 3.2 oz (49.1 kg)   SpO2 98%   BMI 18.01 kg/m  Gen: awake, alert, appearing stated age HEENT: MMM Heart: RRR, no LE edema Lungs: CTAB, no accessory muscle use Abd: BS+, soft, TTP, non-distended, no masses or organomegaly Psych: Age appropriate judgment and insight  Loose stools - Plan: CBC, Comprehensive metabolic panel with GFR, TSH, Magnesium  Encouraged adequate hydration with water and electrolyte replacement. Warning signs and symptoms verbalized.  Will check basic labs to rule out any electrolyte derangements.  Probably secondary to instant coffee and high sugar intake.  Consider Metamucil/Benefiber supplementation to bulk up the stool if needed. F/u prn. The patient and his daughter voiced understanding and agreement to the plan.  Mabel Mt Barton, DO 06/10/24 12:57 PM

## 2024-06-11 NOTE — Telephone Encounter (Signed)
 Copied from CRM [id number]. Topic: Clinical - Lab/Test Results >> Jun 10, 2024  6:01 PM Taleah C wrote: Reason for CRM: pt's daughter called back and I relayed lab results and schedule labs.   She wanted to know if Dr. Frann still wants him to continue taking fiber supplements. Please call and advise.

## 2024-06-11 NOTE — Progress Notes (Signed)
 Pt called and lvm to return call

## 2024-06-11 NOTE — Telephone Encounter (Signed)
 Only if he is still having lingering loose stools.

## 2024-06-12 ENCOUNTER — Other Ambulatory Visit

## 2024-06-12 ENCOUNTER — Other Ambulatory Visit (INDEPENDENT_AMBULATORY_CARE_PROVIDER_SITE_OTHER)

## 2024-06-12 DIAGNOSIS — D649 Anemia, unspecified: Secondary | ICD-10-CM

## 2024-06-12 DIAGNOSIS — E876 Hypokalemia: Secondary | ICD-10-CM | POA: Diagnosis not present

## 2024-06-12 NOTE — Addendum Note (Signed)
 Addended by: EMALINE ELMS on: 06/12/2024 04:09 PM   Modules accepted: Orders

## 2024-06-13 ENCOUNTER — Ambulatory Visit: Payer: Self-pay | Admitting: Family Medicine

## 2024-06-13 DIAGNOSIS — D649 Anemia, unspecified: Secondary | ICD-10-CM

## 2024-06-15 NOTE — Telephone Encounter (Signed)
 Copied from CRM #[id number]. Topic: Clinical - Request for Lab/Test Order >> Jun 15, 2024 10:02 AM Wess RAMAN wrote: Reason for CRM: Patient's daughter, Mar Walmer,  would like to know when Frann Mabel Mt, DO would like patient to come back and do B12/folate and iron studies.  Callback #: [phone]

## 2024-06-16 NOTE — Addendum Note (Signed)
 Addended by: DORLENE CHIQUITA RAMAN on: 06/16/2024 03:29 PM   Modules accepted: Orders

## 2024-06-17 LAB — CBC WITH DIFFERENTIAL/PLATELET
Absolute Lymphocytes: 1456 {cells}/uL (ref 850–3900)
Absolute Monocytes: 552 {cells}/uL (ref 200–950)
Basophils Absolute: 62 {cells}/uL (ref 0–200)
Basophils Relative: 0.9 %
Eosinophils Absolute: 214 {cells}/uL (ref 15–500)
Eosinophils Relative: 3.1 %
HCT: 40.1 % (ref 38.5–50.0)
Hemoglobin: 12.5 g/dL — ABNORMAL LOW (ref 13.2–17.1)
MCH: 26.6 pg — ABNORMAL LOW (ref 27.0–33.0)
MCHC: 31.2 g/dL — ABNORMAL LOW (ref 32.0–36.0)
MCV: 85.3 fL (ref 80.0–100.0)
MPV: 9.8 fL (ref 7.5–12.5)
Monocytes Relative: 8 %
Neutro Abs: 4616 {cells}/uL (ref 1500–7800)
Neutrophils Relative %: 66.9 %
Platelets: 352 Thousand/uL (ref 140–400)
RBC: 4.7 Million/uL (ref 4.20–5.80)
RDW: 14.1 % (ref 11.0–15.0)
Total Lymphocyte: 21.1 %
WBC: 6.9 Thousand/uL (ref 3.8–10.8)

## 2024-06-17 LAB — COMPLETE METABOLIC PANEL WITHOUT GFR
AG Ratio: 1.8 (calc) (ref 1.0–2.5)
ALT: 35 U/L (ref 9–46)
AST: 19 U/L (ref 10–35)
Albumin: 4.4 g/dL (ref 3.6–5.1)
Alkaline phosphatase (APISO): 96 U/L (ref 35–144)
BUN/Creatinine Ratio: 28 (calc) — ABNORMAL HIGH (ref 6–22)
BUN: 37 mg/dL — ABNORMAL HIGH (ref 7–25)
CO2: 22 mmol/L (ref 20–32)
Calcium: 10 mg/dL (ref 8.6–10.3)
Chloride: 104 mmol/L (ref 98–110)
Creat: 1.34 mg/dL — ABNORMAL HIGH (ref 0.70–1.28)
Globulin: 2.4 g/dL (ref 1.9–3.7)
Glucose, Bld: 175 mg/dL — ABNORMAL HIGH (ref 65–99)
Potassium: 5.1 mmol/L (ref 3.5–5.3)
Sodium: 137 mmol/L (ref 135–146)
Total Bilirubin: 0.5 mg/dL (ref 0.2–1.2)
Total Protein: 6.8 g/dL (ref 6.1–8.1)

## 2024-06-17 LAB — IRON,TIBC AND FERRITIN PANEL
%SAT: 21 % (ref 20–48)
Ferritin: 449 ng/mL — ABNORMAL HIGH (ref 24–380)
Iron: 62 ug/dL (ref 50–180)
TIBC: 294 ug/dL (ref 250–425)

## 2024-06-17 LAB — TEST AUTHORIZATION

## 2024-06-17 LAB — B12 AND FOLATE PANEL
Folate: 18.7 ng/mL
Vitamin B-12: 808 pg/mL (ref 200–1100)

## 2024-07-06 ENCOUNTER — Ambulatory Visit (INDEPENDENT_AMBULATORY_CARE_PROVIDER_SITE_OTHER): Admitting: Family Medicine

## 2024-07-06 ENCOUNTER — Ambulatory Visit: Payer: Self-pay | Admitting: Family Medicine

## 2024-07-06 ENCOUNTER — Encounter: Payer: Self-pay | Admitting: Family Medicine

## 2024-07-06 ENCOUNTER — Other Ambulatory Visit: Payer: Self-pay | Admitting: Family Medicine

## 2024-07-06 VITALS — BP 118/66 | HR 83 | Temp 97.0°F | Ht 65.0 in | Wt 114.2 lb

## 2024-07-06 DIAGNOSIS — I10 Essential (primary) hypertension: Secondary | ICD-10-CM | POA: Diagnosis not present

## 2024-07-06 DIAGNOSIS — E1142 Type 2 diabetes mellitus with diabetic polyneuropathy: Secondary | ICD-10-CM

## 2024-07-06 DIAGNOSIS — Z23 Encounter for immunization: Secondary | ICD-10-CM

## 2024-07-06 DIAGNOSIS — Z794 Long term (current) use of insulin: Secondary | ICD-10-CM

## 2024-07-06 DIAGNOSIS — Z7984 Long term (current) use of oral hypoglycemic drugs: Secondary | ICD-10-CM

## 2024-07-06 DIAGNOSIS — E782 Mixed hyperlipidemia: Secondary | ICD-10-CM | POA: Diagnosis not present

## 2024-07-06 DIAGNOSIS — R634 Abnormal weight loss: Secondary | ICD-10-CM

## 2024-07-06 DIAGNOSIS — E039 Hypothyroidism, unspecified: Secondary | ICD-10-CM

## 2024-07-06 LAB — MICROALBUMIN / CREATININE URINE RATIO
Creatinine,U: 41.1 mg/dL
Microalb Creat Ratio: UNDETERMINED mg/g (ref 0.0–30.0)
Microalb, Ur: <0.7 mg/dL

## 2024-07-06 LAB — HEMOGLOBIN A1C: Hgb A1c MFr Bld: 10 % — ABNORMAL HIGH (ref 4.6–6.5)

## 2024-07-06 LAB — GLUCOSE, RANDOM: Glucose, Bld: 234 mg/dL — ABNORMAL HIGH (ref 70–99)

## 2024-07-06 MED ORDER — EZETIMIBE 10 MG PO TABS: 10.0000 mg | ORAL_TABLET | Freq: Every day | ORAL | 3 refills | Status: AC

## 2024-07-06 MED ORDER — INSULIN GLARGINE 100 UNIT/ML SOLOSTAR PEN: 10.0000 [IU] | PEN_INJECTOR | Freq: Every day | SUBCUTANEOUS | 1 refills | Status: AC

## 2024-07-06 MED ORDER — EMPAGLIFLOZIN 25 MG PO TABS: 25.0000 mg | ORAL_TABLET | Freq: Every day | ORAL | 3 refills | Status: DC

## 2024-07-06 MED ORDER — ACCU-CHEK SOFTCLIX LANCETS MISC: 0 refills | Status: DC

## 2024-07-06 MED ORDER — SITAGLIPTIN 50 MG PO TABS: 50.0000 mg | ORAL_TABLET | Freq: Every day | ORAL | 3 refills | Status: DC

## 2024-07-06 NOTE — Assessment & Plan Note (Signed)
 LDL cholesterol is at goal. Continue atorvastatin  80 mg daily and ezetimibe  10 mg daily.

## 2024-07-06 NOTE — Progress Notes (Signed)
 [facility] [address] [place] [address] Dept: [phone] Dept Fax: [phone]  Chronic Care Office Visit  Subjective:    Patient ID: Garrett Rice, male    DOB: 1945-12-13, 78 y.o..   MRN: [id number]  Chief Complaint  Patient presents with   Diabetes    2 month f/u DM.  BS averaging 110 -120.  Flu shot today.    History of Present Illness:  Patient is in today for reassessment of chronic medical issues.  Garrett Rice has Type 2 diabetes. He is managed on metformin  1000 mg twice daily, glipizide  XL 10 mg daily, empagliflozin  (Jardiance ) 25 mg daily, and sitagliptin  25 mg daily. He has had unintended weight loss, being down 19 lbs since Jan. and 37 lbs in the past 4 years. His last A1c was > 15.8. Garrett Rice daughter is working with him on portion control and reducing his consumption of sweets and sticky rice. She notes the challenges of his wife giving him too much of the wrong foods. She is also encouraging him to get out and walk more. She notes that in the past, he was on insulin , which did seem to help get his diabetes under better control.   Garrett Rice has hypertension. He is managed on HCTZ 12.5 mg daily and lisinopril  20 mg dialy.   Garrett Rice has hyperlipidemia. He is managed on atorvastatin  80 mg daily and ezetimibe  10 mg daily.   Garrett Rice has a history of hypothyroidism. He is managed on levothyroxine  88 mcg daily.  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 Outpatient Medications Prior to Visit  Medication Sig Dispense Refill   aspirin EC 81 MG tablet Take 81 mg by mouth daily.     atorvastatin  (LIPITOR) 80 MG tablet Take 1 tablet (80 mg total) by mouth daily. 90 tablet 3   Blood Glucose Monitoring Suppl DEVI 1 each by Does not apply route in the morning, at noon, and at bedtime. May substitute to any manufacturer covered by patient's insurance. 1 each 0   erythromycin ophthalmic ointment SMARTSIG:In Eye(s)     glipiZIDE  (GLUCOTROL  XL) 10 MG 24 hr tablet Take 1 tablet (10 mg total) by mouth daily. 90 tablet 3   glucose blood (ACCU-CHEK GUIDE TEST) test strip USE 1 STRIP TO CHECK GLUCOSE THREE TIMES DAILY. TEST IN THE MORNING (Fasting), AT NOON, AND AT NIGHT 100 each 3   hydrochlorothiazide  (HYDRODIURIL ) 12.5 MG tablet Take 1 tablet (12.5 mg total) by mouth daily. 90 tablet 3   levothyroxine  (SYNTHROID ) 88 MCG tablet Take 1 tablet (88 mcg total) by mouth daily before breakfast. 90 tablet 3   lisinopril  (ZESTRIL ) 20 MG tablet Take 1 tablet (20 mg total) by mouth daily. 90 tablet 3   metFORMIN  (GLUCOPHAGE ) 1000 MG tablet Take 1 tablet (1,000 mg total) by mouth 2 (two)  times daily with a meal. 180 tablet 3   Olopatadine HCl 0.2 % SOLN Apply to eye.     omeprazole (PRILOSEC) 40 MG capsule Take 40 mg by mouth 2 (two) times daily.     SITagliptin  25 MG TABS Take 25 mg by mouth daily at 12 noon. 90 tablet 3   Accu-Chek Softclix Lancets lancets test three times a day 100 each 0   empagliflozin  (JARDIANCE ) 25 MG TABS tablet Take 1 tablet (25 mg total) by mouth daily before breakfast. 30 tablet 3   ezetimibe  (ZETIA ) 10 MG tablet Take 1 tablet (10 mg total) by mouth daily. 90  tablet 3   insulin  glargine (LANTUS ) 100 UNIT/ML Solostar Pen Inject 10 Units into the skin at bedtime. 15 mL 1   No facility-administered medications prior to visit.   No Known Allergies Objective:   Today's Vitals   07/06/24 1336  BP: 118/66  Pulse: 83  Temp: (!) 97 F (36.1 C)  TempSrc: Temporal  SpO2: 100%  Weight: 114 lb 3.2 oz (51.8 kg)  Height: 5' 5 (1.651 m)   Body mass index is 19 kg/m.   General: Well developed, well nourished. No acute distress. Psych: Alert and oriented. Normal mood and affect.  Health Maintenance Due  Topic Date Due   Diabetic kidney evaluation - Urine ACR  Never done   Zoster Vaccines- Shingrix (1 of 2) Never done   Medicare Annual Wellness (AWV)  10/24/2023   Influenza Vaccine  04/24/2024   Lab Results    Latest Ref Rng & Units 06/12/2024    4:09 PM 06/10/2024    1:00 PM 03/19/2024   11:36 PM  CBC  WBC 3.8 - 10.8 Thousand/uL 6.9  6.9    Hemoglobin 13.2 - 17.1 g/dL 87.4  87.4  85.6   Hematocrit 38.5 - 50.0 % 40.1  39.0  42.0   Platelets 140 - 400 Thousand/uL 352  354.0        Latest Ref Rng & Units 06/12/2024    4:09 PM 06/10/2024    1:00 PM 04/01/2024   10:37 AM  CMP  Glucose 65 - 99 mg/dL 824  838  755   BUN 7 - 25 mg/dL 37  29  14   Creatinine 0.70 - 1.28 mg/dL 8.65  8.90  9.09   Sodium 135 - 146 mmol/L 137  137  136   Potassium 3.5 - 5.3 mmol/L 5.1  5.4 No hemolysis seen  3.9   Chloride 98 - 110 mmol/L 104  101  99   CO2 20 - 32 mmol/L 22  26  29    Calcium  8.6 - 10.3 mg/dL 89.9  89.4  9.7   Total Protein 6.1 - 8.1 g/dL 6.8  7.0    Total Bilirubin 0.2 - 1.2 mg/dL 0.5  0.6    Alkaline Phos 39 - 117 U/L  91    AST 10 - 35 U/L 19  21    ALT 9 - 46 U/L 35  34     Last thyroid  functions Lab Results  Component Value Date   TSH 5.36 06/10/2024   Last vitamin B12 and Folate Lab Results  Component Value Date   VITAMINB12 808 06/12/2024   FOLATE 18.7 06/12/2024      Assessment & Plan:   Problem List Items Addressed This  Visit       Cardiovascular and Mediastinum   Essential hypertension   Blood pressure is in good control. Continue HCTZ 12.5 mg daily  and lisinopril  20 mg daily.      Relevant Medications   ezetimibe  (ZETIA ) 10 MG tablet     Endocrine   Acquired hypothyroidism   TSH is at goal. Continue levothyroxine  88 mcg daily.      Type 2 diabetes mellitus with diabetic polyneuropathy (HCC) - Primary   Diabetes control is improving. Home glucose is reported at 110-120s. His daughter continues working with him on improved diet and physical activity. His weight is coming up some which is encouraging. Continue metformin  1,000 mg bid, glipizide  XL 10 mg daily, empagliflozin  25 mg daily, sitagliptin  25 mg daily and insulin  glargine (Lantus ) 10 units at bedtime. I will reassess his A1c today and check his micral.      Relevant Medications   Accu-Chek Softclix Lancets lancets   empagliflozin  (JARDIANCE ) 25 MG TABS tablet   insulin  glargine (LANTUS ) 100 UNIT/ML Solostar Pen   Other Relevant Orders   Glucose, random   Hemoglobin A1c   Microalbumin / creatinine urine ratio     Other   Hyperlipidemia (Chronic)   LDL cholesterol is at goal. Continue atorvastatin  80 mg daily and ezetimibe  10 mg daily.      Relevant Medications   ezetimibe  (ZETIA ) 10 MG tablet   Weight loss, unintentional   Likely due to uncontrolled diabetes. Improving now that his diabetes is doing better.      Other Visit Diagnoses       Need for immunization against influenza       Relevant Orders   Flu vaccine HIGH DOSE PF(Fluzone Trivalent) (Completed)       Return in about 3 months (around 10/06/2024) for Reassessment.   Garnette CHRISTELLA Simpler, MD

## 2024-07-06 NOTE — Assessment & Plan Note (Signed)
 Likely due to uncontrolled diabetes. Improving now that his diabetes is doing better.

## 2024-07-06 NOTE — Assessment & Plan Note (Signed)
 Blood pressure is in good control. Continue HCTZ 12.5 mg daily and lisinopril 20 mg daily.

## 2024-07-06 NOTE — Assessment & Plan Note (Signed)
 TSH is at goal. Continue levothyroxine 88 mcg daily.

## 2024-07-06 NOTE — Assessment & Plan Note (Signed)
 Diabetes control is improving. Home glucose is reported at 110-120s. His daughter continues working with him on improved diet and physical activity. His weight is coming up some which is encouraging. Continue metformin  1,000 mg bid, glipizide  XL 10 mg daily, empagliflozin  25 mg daily, sitagliptin  25 mg daily and insulin  glargine (Lantus ) 10 units at bedtime. I will reassess his A1c today and check his micral.

## 2024-10-05 ENCOUNTER — Ambulatory Visit

## 2024-10-05 VITALS — BP 106/60 | HR 77 | Temp 97.7°F | Ht 63.5 in | Wt 117.4 lb

## 2024-10-05 DIAGNOSIS — Z0000 Encounter for general adult medical examination without abnormal findings: Secondary | ICD-10-CM

## 2024-10-05 NOTE — Progress Notes (Signed)
 "  Chief Complaint  Patient presents with   Medicare Wellness     Subjective:   Ankit Degregorio is a 79 y.o. male who presents for a Medicare Annual Wellness Visit.  Visit info / Clinical Intake: Medicare Wellness Visit Type:: Initial Annual Wellness Visit Persons participating in visit and providing information:: patient & caregiver Medicare Wellness Visit Mode:: In-person (required for WTM) Interpreter Needed?: Yes Interpreter Name: daughter Simmaly Patient Declined Interpreter : No Interpretation services provided by: Family member interpreted visit Patient signed [place] waiver: No Pre-visit prep was completed: yes AWV questionnaire completed by patient prior to visit?: no Living arrangements:: lives with spouse/significant other Patient's Overall Health Status Rating: good Typical amount of pain: none Does pain affect daily life?: no Are you currently prescribed opioids?: no  Dietary Habits and Nutritional Risks How many meals a day?: 2 Eats fruit and vegetables daily?: yes Most meals are obtained by: preparing own meals In the last 2 weeks, have you had any of the following?: none Diabetic:: (!) yes Any non-healing wounds?: no How often do you check your BS?: 1 Would you like to be referred to a Nutritionist or for Diabetic Management? : no  Functional Status Activities of Daily Living (to include ambulation/medication): Independent Ambulation: Independent Medication Administration: Independent Home Management (perform basic housework or laundry): Independent Manage your own finances?: yes Primary transportation is: driving Concerns about vision?: no *vision screening is required for WTM* Concerns about hearing?: no  Fall Screening Falls in the past year?: 0 Number of falls in past year: 0 Was there an injury with Fall?: 0 Fall Risk Category Calculator: 0 Patient Fall Risk Level: Low Fall Risk  Fall Risk Patient at Risk for Falls Due to: Medication  side effect Fall risk Follow up: Falls prevention discussed; Falls evaluation completed  Home and Transportation Safety: All rugs have non-skid backing?: yes All stairs or steps have railings?: yes Grab bars in the bathtub or shower?: yes Have non-skid surface in bathtub or shower?: yes Good home lighting?: yes Regular seat belt use?: yes Hospital stays in the last year:: no  Cognitive Assessment Difficulty concentrating, remembering, or making decisions? : no Will 6CIT or Mini Cog be Completed: no 6CIT or Mini Cog Declined: patient alert, oriented, able to answer questions appropriately and recall recent events (language barrier daughter interpreting)  Advance Directives (For Healthcare) Does Patient Have a Medical Advance Directive?: No Would patient like information on creating a medical advance directive?: No - Patient declined  Reviewed/Updated  Reviewed/Updated: Reviewed All (Medical, Surgical, Family, Medications, Allergies, Care Teams, Patient Goals)    Allergies (verified) Patient has no known allergies.   Current Medications (verified) Outpatient Encounter Medications as of 10/05/2024  Medication Sig   Accu-Chek Softclix Lancets lancets USE 1 LANCET TO CHECK GLUCOSE THREE TIMES DAILY   atorvastatin  (LIPITOR) 80 MG tablet Take 1 tablet (80 mg total) by mouth daily.   Blood Glucose Monitoring Suppl DEVI 1 each by Does not apply route in the morning, at noon, and at bedtime. May substitute to any manufacturer covered by patient's insurance.   empagliflozin  (JARDIANCE ) 25 MG TABS tablet Take 1 tablet (25 mg total) by mouth daily before breakfast.   erythromycin ophthalmic ointment SMARTSIG:In Eye(s)   ezetimibe  (ZETIA ) 10 MG tablet Take 1 tablet (10 mg total) by mouth daily.   glipiZIDE  (GLUCOTROL  XL) 10 MG 24 hr tablet Take 1 tablet (10 mg total) by mouth daily.   glucose blood (ACCU-CHEK GUIDE TEST) test strip USE 1  STRIP TO CHECK GLUCOSE THREE TIMES DAILY. TEST IN THE  MORNING (Fasting), AT NOON, AND AT NIGHT   hydrochlorothiazide  (HYDRODIURIL ) 12.5 MG tablet Take 1 tablet (12.5 mg total) by mouth daily.   insulin  glargine (LANTUS ) 100 UNIT/ML Solostar Pen Inject 10 Units into the skin at bedtime.   levothyroxine  (SYNTHROID ) 88 MCG tablet Take 1 tablet (88 mcg total) by mouth daily before breakfast.   lisinopril  (ZESTRIL ) 20 MG tablet Take 1 tablet (20 mg total) by mouth daily.   metFORMIN  (GLUCOPHAGE ) 1000 MG tablet Take 1 tablet (1,000 mg total) by mouth 2 (two) times daily with a meal.   Olopatadine HCl 0.2 % SOLN Apply to eye.   omeprazole (PRILOSEC) 40 MG capsule Take 40 mg by mouth 2 (two) times daily.   SITagliptin  50 MG TABS Take 50 mg by mouth daily at 12 noon.   aspirin EC 81 MG tablet Take 81 mg by mouth daily. (Patient not taking: Reported on 10/05/2024)   No facility-administered encounter medications on file as of 10/05/2024.    History: Past Medical History:  Diagnosis Date   Diabetes mellitus without complication (HCC)    Hyperlipidemia    Past Surgical History:  Procedure Laterality Date   CATARACT EXTRACTION, BILATERAL     cataract   History reviewed. No pertinent family history. Social History   Occupational History   Occupation: Retired  Tobacco Use   Smoking status: Former    Types: Cigarettes   Smokeless tobacco: Never  Vaping Use   Vaping status: Never Used  Substance and Sexual Activity   Alcohol use: Never   Drug use: Never   Sexual activity: Yes   Tobacco Counseling Counseling given: Not Answered  SDOH Screenings   Food Insecurity: No Food Insecurity (10/05/2024)  Housing: Unknown (10/05/2024)  Transportation Needs: No Transportation Needs (10/05/2024)  Utilities: Not At Risk (10/05/2024)  Alcohol Screen: Low Risk (10/05/2024)  Depression (PHQ2-9): Low Risk (10/05/2024)  Financial Resource Strain: Low Risk (10/05/2024)  Physical Activity: Insufficiently Active (10/05/2024)  Social Connections: Moderately  Integrated (10/05/2024)  Stress: No Stress Concern Present (10/05/2024)  Tobacco Use: Medium Risk (10/05/2024)  Health Literacy: Adequate Health Literacy (10/05/2024)   See flowsheets for full screening details  Depression Screen PHQ 2 & 9 Depression Scale- Over the past 2 weeks, how often have you been bothered by any of the following problems? Little interest or pleasure in doing things: 0 Feeling down, depressed, or hopeless (PHQ Adolescent also includes...irritable): 0 PHQ-2 Total Score: 0 Trouble falling or staying asleep, or sleeping too much: 1 Feeling tired or having little energy: 0 Poor appetite or overeating (PHQ Adolescent also includes...weight loss): 0 Feeling bad about yourself - or that you are a failure or have let yourself or your family down: 0 Trouble concentrating on things, such as reading the newspaper or watching television (PHQ Adolescent also includes...like school work): 0 Moving or speaking so slowly that other people could have noticed. Or the opposite - being so fidgety or restless that you have been moving around a lot more than usual: 0 Thoughts that you would be better off dead, or of hurting yourself in some way: 0 PHQ-9 Total Score: 1 If you checked off any problems, how difficult have these problems made it for you to do your work, take care of things at home, or get along with other people?: Not difficult at all  Depression Treatment Depression Interventions/Treatment : EYV7-0 Score <4 Follow-up Not Indicated     Goals Addressed  This Visit's Progress    Patient Stated       10/05/2024, stay active and keep BP down             Objective:    Today's Vitals   10/05/24 0933  BP: 106/60  Pulse: 77  Temp: 97.7 F (36.5 C)  TempSrc: Oral  SpO2: 96%  Weight: 117 lb 6.4 oz (53.3 kg)  Height: 5' 3.5 (1.613 m)   Body mass index is 20.47 kg/m.  Hearing/Vision screen Hearing Screening - Comments:: Denies hearing issues Vision  Screening - Comments:: Regular eye exams Immunizations and Health Maintenance Health Maintenance  Topic Date Due   Zoster Vaccines- Shingrix (1 of 2) Never done   COVID-19 Vaccine (3 - 2025-26 season) 05/25/2024   OPHTHALMOLOGY EXAM  09/10/2024   FOOT EXAM  10/07/2024   HEMOGLOBIN A1C  01/04/2025   Diabetic kidney evaluation - eGFR measurement  06/12/2025   Diabetic kidney evaluation - Urine ACR  07/06/2025   Medicare Annual Wellness (AWV)  10/05/2025   DTaP/Tdap/Td (2 - Td or Tdap) 06/24/2028   Pneumococcal Vaccine: 50+ Years  Completed   Influenza Vaccine  Completed   Hepatitis C Screening  Completed   Meningococcal B Vaccine  Aged Out        Assessment/Plan:  This is a routine wellness examination for Rorik.  Patient Care Team: Thedora Garnette HERO, MD as PCP - General (Family Medicine) Beverlee, [place] GAILS, MD as Referring Physician (Ophthalmology)  I have personally reviewed and noted the following in the patients chart:   Medical and social history Use of alcohol, tobacco or illicit drugs  Current medications and supplements including opioid prescriptions. Functional ability and status Nutritional status Physical activity Advanced directives List of other physicians Hospitalizations, surgeries, and ER visits in previous 12 months Vitals Screenings to include cognitive, depression, and falls Referrals and appointments  No orders of the defined types were placed in this encounter.  In addition, I have reviewed and discussed with patient certain preventive protocols, quality metrics, and best practice recommendations. A written personalized care plan for preventive services as well as general preventive health recommendations were provided to patient.   Ardella FORBES Dawn, LPN   8/87/7973   Return in 1 year (on 10/05/2025).  After Visit Summary: (In Person-Printed) AVS printed and given to the patient  Nurse Notes: Vaccines not given: covid declined today Will obtain  shingles at pharmacy HM Addressed: Eye exam requested from eye doctor.  "

## 2024-10-05 NOTE — Patient Instructions (Signed)
 Mr. Sakai,  Thank you for taking the time for your Medicare Wellness Visit. I appreciate your continued commitment to your health goals. Please review the care plan we discussed, and feel free to reach out if I can assist you further.  Please note that Annual Wellness Visits do not include a physical exam. Some assessments may be limited, especially if the visit was conducted virtually. If needed, we may recommend an in-person follow-up with your provider.  Ongoing Care Seeing your primary care provider every 3 to 6 months helps us  monitor your health and provide consistent, personalized care.   Referrals If a referral was made during today's visit and you haven't received any updates within two weeks, please contact the referred provider directly to check on the status.  Recommended Screenings:  Health Maintenance  Topic Date Due   Zoster (Shingles) Vaccine (1 of 2) Never done   Medicare Annual Wellness Visit  10/24/2023   COVID-19 Vaccine (3 - 2025-26 season) 05/25/2024   Eye exam for diabetics  09/10/2024   Complete foot exam   10/07/2024   Hemoglobin A1C  01/04/2025   Yearly kidney function blood test for diabetes  06/12/2025   Yearly kidney health urinalysis for diabetes  07/06/2025   DTaP/Tdap/Td vaccine (2 - Td or Tdap) 06/24/2028   Pneumococcal Vaccine for age over 64  Completed   Flu Shot  Completed   Hepatitis C Screening  Completed   Meningitis B Vaccine  Aged Out       10/05/2024    9:41 AM  Advanced Directives  Does Patient Have a Medical Advance Directive? No  Would patient like information on creating a medical advance directive? No - Patient declined    Vision: Annual vision screenings are recommended for early detection of glaucoma, cataracts, and diabetic retinopathy. These exams can also reveal signs of chronic conditions such as diabetes and high blood pressure.  Dental: Annual dental screenings help detect early signs of oral cancer, gum  disease, and other conditions linked to overall health, including heart disease and diabetes.  Please see the attached documents for additional preventive care recommendations.

## 2024-10-06 ENCOUNTER — Encounter: Payer: Self-pay | Admitting: Family Medicine

## 2024-10-06 ENCOUNTER — Ambulatory Visit: Payer: Self-pay | Admitting: Family Medicine

## 2024-10-06 ENCOUNTER — Ambulatory Visit (INDEPENDENT_AMBULATORY_CARE_PROVIDER_SITE_OTHER): Admitting: Family Medicine

## 2024-10-06 VITALS — BP 114/68 | HR 70 | Temp 98.1°F | Ht 63.5 in | Wt 118.2 lb

## 2024-10-06 DIAGNOSIS — Z794 Long term (current) use of insulin: Secondary | ICD-10-CM

## 2024-10-06 DIAGNOSIS — E039 Hypothyroidism, unspecified: Secondary | ICD-10-CM | POA: Diagnosis not present

## 2024-10-06 DIAGNOSIS — E1142 Type 2 diabetes mellitus with diabetic polyneuropathy: Secondary | ICD-10-CM

## 2024-10-06 DIAGNOSIS — Z7984 Long term (current) use of oral hypoglycemic drugs: Secondary | ICD-10-CM

## 2024-10-06 DIAGNOSIS — E782 Mixed hyperlipidemia: Secondary | ICD-10-CM | POA: Diagnosis not present

## 2024-10-06 DIAGNOSIS — I10 Essential (primary) hypertension: Secondary | ICD-10-CM

## 2024-10-06 LAB — HEMOGLOBIN A1C: Hgb A1c MFr Bld: 11.7 % — ABNORMAL HIGH (ref 4.6–6.5)

## 2024-10-06 LAB — GLUCOSE, RANDOM: Glucose, Bld: 169 mg/dL — ABNORMAL HIGH (ref 70–99)

## 2024-10-06 MED ORDER — SITAGLIPTIN PHOSPHATE 100 MG PO TABS: 100.0000 mg | ORAL_TABLET | Freq: Every day | ORAL | 11 refills | Status: AC

## 2024-10-06 MED ORDER — GLIPIZIDE ER 10 MG PO TB24: 10.0000 mg | ORAL_TABLET | Freq: Every day | ORAL | 3 refills | Status: AC

## 2024-10-06 MED ORDER — EMPAGLIFLOZIN 25 MG PO TABS: 25.0000 mg | ORAL_TABLET | Freq: Every day | ORAL | 3 refills | Status: AC

## 2024-10-06 MED ORDER — ATORVASTATIN CALCIUM 80 MG PO TABS: 80.0000 mg | ORAL_TABLET | Freq: Every day | ORAL | 3 refills | Status: AC

## 2024-10-06 MED ORDER — LEVOTHYROXINE SODIUM 88 MCG PO TABS: 88.0000 ug | ORAL_TABLET | Freq: Every day | ORAL | 3 refills | Status: AC

## 2024-10-06 MED ORDER — LISINOPRIL 20 MG PO TABS: 20.0000 mg | ORAL_TABLET | Freq: Every day | ORAL | 3 refills | Status: AC

## 2024-10-06 NOTE — Assessment & Plan Note (Addendum)
 Diabetes control is improving. Home glucose is reported at ~ 120. His daughter continues working with him on improved diet and physical activity. His weight is coming up some which is encouraging. Continue metformin  1,000 mg bid, glipizide  XL 10 mg daily, empagliflozin  25 mg daily, sitagliptin  50 mg daily and insulin  glargine (Lantus ) 10 units at bedtime. I will reassess his A1c today. I have sent word to his daughter that he does need his annual eye exam (was due in December 2025).

## 2024-10-06 NOTE — Patient Instructions (Signed)
 Garrett Rice's last eye exam with Dr. Radionchenko was in December 2024. He was due for his annual eye exam in Dec. 2025. Please call and schedule this exam.

## 2024-10-06 NOTE — Assessment & Plan Note (Signed)
 TSH is at goal. Continue levothyroxine 88 mcg daily.

## 2024-10-06 NOTE — Assessment & Plan Note (Signed)
 LDL cholesterol is at goal. Continue atorvastatin  80 mg daily and ezetimibe  10 mg daily.

## 2024-10-06 NOTE — Progress Notes (Signed)
 " [facility] [address] [place] [address] Dept: [phone] Dept Fax: [phone]  Chronic Care Office Visit  Subjective:    Patient ID: Garrett Rice, male    DOB: May 04, 1946, 79 y.o..   MRN: [id number]  Chief Complaint  Patient presents with   Diabetes    3 month f/u DM.  Fasting today,  no concerns.  Average BS 140 - 120.     Interpretation provided by Garrett Rice daughter, Garrett Rice. She is a ecologist. She connected via cell phone today.  History of Present Illness:  Patient is in today for reassessment of chronic medical conditions.   Garrett Rice has Type 2 diabetes. He is managed on metformin  1000 mg twice daily, glipizide  XL 10 mg daily, empagliflozin  (Jardiance ) 25 mg daily, and sitagliptin  50 mg daily. He had been experiencing unintended weight loss, being down 19 lbs in 2025. and 37 lbs in the past 4 years. Last July, his A1c was > 15.8. Garrett Rice daughter has worked with him on portion control and reducing his consumption of sweets and sticky rice. There have been challenges, as his wife  had been giving him too much of the wrong foods. Garrett Rice is getting out to walk a bit more now. They note his home blood sugars are 120-140.   Garrett Rice has hypertension. He is managed on HCTZ 12.5 mg daily and lisinopril  20 mg dialy.   Garrett Rice has hyperlipidemia. He is managed on atorvastatin  80 mg daily and ezetimibe  10 mg daily.   Garrett Rice has a history of hypothyroidism. He is managed on levothyroxine  88 mcg daily.  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 Outpatient Medications Prior to Visit  Medication Sig Dispense Refill   Accu-Chek Softclix Lancets lancets USE 1 LANCET TO CHECK GLUCOSE THREE TIMES DAILY 100 each 0   aspirin EC 81 MG tablet Take 81 mg by mouth daily. (Patient not taking: Reported on 10/05/2024)     atorvastatin  (LIPITOR) 80 MG tablet Take 1 tablet (80 mg total) by mouth daily. 90 tablet 3   Blood Glucose Monitoring Suppl DEVI 1 each by Does not apply route in the morning, at noon, and at bedtime. May substitute to any manufacturer covered by patient's insurance. 1 each 0   empagliflozin  (JARDIANCE ) 25 MG TABS tablet Take 1 tablet (25 mg total) by mouth daily before breakfast. 30 tablet 3   erythromycin ophthalmic ointment SMARTSIG:In Eye(s)     ezetimibe  (ZETIA ) 10 MG tablet Take 1 tablet (10 mg total) by mouth daily. 90 tablet 3   glipiZIDE  (GLUCOTROL  XL) 10 MG 24 hr tablet Take 1 tablet (10 mg total) by mouth daily. 90 tablet 3   glucose blood (ACCU-CHEK GUIDE TEST) test strip USE 1 STRIP TO CHECK GLUCOSE THREE TIMES DAILY. TEST IN THE MORNING (Fasting), AT NOON, AND  AT NIGHT 100 each 3   hydrochlorothiazide  (HYDRODIURIL ) 12.5 MG tablet Take 1 tablet (12.5 mg total) by mouth daily. 90 tablet 3   insulin  glargine (LANTUS ) 100 UNIT/ML Solostar Pen Inject 10 Units into the skin at bedtime. 15 mL 1   levothyroxine  (SYNTHROID ) 88 MCG tablet Take 1 tablet (88 mcg total) by mouth daily before breakfast. 90 tablet 3   lisinopril  (ZESTRIL ) 20 MG tablet Take 1 tablet (20 mg total) by mouth daily. 90 tablet 3   metFORMIN  (GLUCOPHAGE ) 1000  MG tablet Take 1 tablet (1,000 mg total) by mouth 2 (two) times daily with a meal. 180 tablet 3   Olopatadine HCl 0.2 % SOLN Apply to eye.     omeprazole (PRILOSEC) 40 MG capsule Take 40 mg by mouth 2 (two) times daily.     SITagliptin  50 MG TABS Take 50 mg by mouth daily at 12 noon. 90 tablet 3   No facility-administered medications prior to visit.   Allergies[1]   Objective:   Today's Vitals   10/06/24 0827  BP: 114/68  Pulse: 70  Temp: 98.1 F (36.7 C)  TempSrc: Temporal  SpO2: 98%  Weight: 118 lb 3.2 oz (53.6 kg)  Height: 5' 3.5 (1.613 m)   Body mass index is 20.61 kg/m.   General: Well developed, well nourished. No acute distress. Psych: Alert and oriented. Normal mood and affect.  Health Maintenance Due  Topic Date Due   Zoster Vaccines- Shingrix (1 of 2) Never done   COVID-19 Vaccine (3 - 2025-26 season) 05/25/2024   OPHTHALMOLOGY EXAM  09/10/2024   Lab Results Lab Results  Component Value Date   TSH 5.36 06/10/2024     Lab Results  Component Value Date   HGBA1C 10.0 (H) 07/06/2024   HGBA1C 15.8 (H) 04/01/2024   HGBA1C 14.4 (H) 01/01/2024   Assessment & Plan:   Problem List Items Addressed This Visit       Cardiovascular and Mediastinum   Essential hypertension   Blood pressure is in good control. Continue HCTZ 12.5 mg daily and lisinopril  20 mg daily.      Relevant Medications   lisinopril  (ZESTRIL ) 20 MG tablet   atorvastatin  (LIPITOR) 80 MG tablet     Endocrine   Acquired hypothyroidism   TSH is at goal. Continue levothyroxine  88 mcg daily.      Relevant Medications   levothyroxine  (SYNTHROID ) 88 MCG tablet   Type 2 diabetes mellitus with diabetic polyneuropathy (HCC) - Primary   Diabetes control is improving. Home glucose is reported at ~ 120. His daughter continues working with him on improved diet and physical activity. His weight is coming up some which is encouraging. Continue metformin  1,000 mg bid, glipizide  XL 10 mg daily,  empagliflozin  25 mg daily, sitagliptin  50 mg daily and insulin  glargine (Lantus ) 10 units at bedtime. I will reassess his A1c today. I have sent word to his daughter that he does need his annual eye exam (was due in December 2025).      Relevant Medications   lisinopril  (ZESTRIL ) 20 MG tablet   glipiZIDE  (GLUCOTROL  XL) 10 MG 24 hr tablet   empagliflozin  (JARDIANCE ) 25 MG TABS tablet   atorvastatin  (LIPITOR) 80 MG tablet   Other Relevant Orders   Glucose, random   Hemoglobin A1c     Other   Hyperlipidemia (Chronic)   LDL cholesterol is at goal. Continue atorvastatin  80 mg daily and ezetimibe  10 mg daily.      Relevant Medications   lisinopril  (  ZESTRIL ) 20 MG tablet   atorvastatin  (LIPITOR) 80 MG tablet   Other Visit Diagnoses       Long term current use of oral hypoglycemic drug           Return in about 3 months (around 01/04/2025) for Reassessment.   Garnette CHRISTELLA Simpler, MD  I,Emily Lagle,acting as a scribe for Garnette CHRISTELLA Simpler, MD.,have documented all relevant documentation on the behalf of Garnette CHRISTELLA Simpler, MD.  I, Garnette CHRISTELLA Simpler, MD, have reviewed all documentation for this visit. The documentation on 10/06/2024 for the exam, diagnosis, procedures, and orders are all accurate and complete.     [1] No Known Allergies  "

## 2024-10-06 NOTE — Assessment & Plan Note (Signed)
 Blood pressure is in good control. Continue HCTZ 12.5 mg daily and lisinopril 20 mg daily.

## 2025-10-11 ENCOUNTER — Ambulatory Visit
